# Patient Record
Sex: Female | Born: 1955 | Race: White | Hispanic: No | Marital: Married | Smoking: Never smoker
Health system: Southern US, Community
[De-identification: ages and names within clinical notes are randomized; demographics above are authoritative.]

## PROBLEM LIST (undated history)

## (undated) DIAGNOSIS — I10 Essential (primary) hypertension: Secondary | ICD-10-CM

## (undated) DIAGNOSIS — I2510 Atherosclerotic heart disease of native coronary artery without angina pectoris: Secondary | ICD-10-CM

## (undated) DIAGNOSIS — Z973 Presence of spectacles and contact lenses: Secondary | ICD-10-CM

## (undated) DIAGNOSIS — E119 Type 2 diabetes mellitus without complications: Secondary | ICD-10-CM

## (undated) HISTORY — PX: BREAST BIOPSY: SHX20

## (undated) HISTORY — PX: TONSILLECTOMY: SUR1361

## (undated) HISTORY — PX: BACK SURGERY: SHX140

## (undated) HISTORY — PX: APPENDECTOMY: SHX54

---

## 2000-07-22 ENCOUNTER — Encounter: Payer: Self-pay | Admitting: Obstetrics and Gynecology

## 2000-07-22 ENCOUNTER — Encounter: Admission: RE | Admit: 2000-07-22 | Discharge: 2000-07-22 | Payer: Self-pay | Admitting: Obstetrics and Gynecology

## 2001-11-04 ENCOUNTER — Encounter: Payer: Self-pay | Admitting: Family Medicine

## 2001-11-04 ENCOUNTER — Ambulatory Visit (HOSPITAL_COMMUNITY): Admission: RE | Admit: 2001-11-04 | Discharge: 2001-11-04 | Payer: Self-pay | Admitting: Family Medicine

## 2002-01-31 ENCOUNTER — Encounter: Payer: Self-pay | Admitting: Obstetrics and Gynecology

## 2002-01-31 ENCOUNTER — Encounter: Admission: RE | Admit: 2002-01-31 | Discharge: 2002-01-31 | Payer: Self-pay | Admitting: Obstetrics and Gynecology

## 2002-03-10 ENCOUNTER — Other Ambulatory Visit: Admission: RE | Admit: 2002-03-10 | Discharge: 2002-03-10 | Payer: Self-pay | Admitting: Obstetrics and Gynecology

## 2003-01-03 ENCOUNTER — Encounter: Payer: Self-pay | Admitting: Emergency Medicine

## 2003-01-03 ENCOUNTER — Emergency Department (HOSPITAL_COMMUNITY): Admission: EM | Admit: 2003-01-03 | Discharge: 2003-01-03 | Payer: Self-pay | Admitting: Emergency Medicine

## 2003-01-09 ENCOUNTER — Inpatient Hospital Stay (HOSPITAL_COMMUNITY): Admission: EM | Admit: 2003-01-09 | Discharge: 2003-01-12 | Payer: Self-pay | Admitting: *Deleted

## 2003-01-09 ENCOUNTER — Encounter: Payer: Self-pay | Admitting: Cardiovascular Disease

## 2003-02-06 ENCOUNTER — Encounter: Admission: RE | Admit: 2003-02-06 | Discharge: 2003-05-07 | Payer: Self-pay | Admitting: Family Medicine

## 2004-05-26 ENCOUNTER — Encounter: Admission: RE | Admit: 2004-05-26 | Discharge: 2004-05-26 | Payer: Self-pay | Admitting: Obstetrics and Gynecology

## 2004-05-28 ENCOUNTER — Encounter: Admission: RE | Admit: 2004-05-28 | Discharge: 2004-08-26 | Payer: Self-pay | Admitting: Family Medicine

## 2004-06-13 ENCOUNTER — Other Ambulatory Visit: Admission: RE | Admit: 2004-06-13 | Discharge: 2004-06-13 | Payer: Self-pay | Admitting: Obstetrics and Gynecology

## 2005-08-13 ENCOUNTER — Encounter: Admission: RE | Admit: 2005-08-13 | Discharge: 2005-08-13 | Payer: Self-pay | Admitting: Family Medicine

## 2005-08-13 ENCOUNTER — Other Ambulatory Visit: Admission: RE | Admit: 2005-08-13 | Discharge: 2005-08-13 | Payer: Self-pay | Admitting: Obstetrics and Gynecology

## 2006-10-14 ENCOUNTER — Encounter: Admission: RE | Admit: 2006-10-14 | Discharge: 2006-10-14 | Payer: Self-pay | Admitting: Family Medicine

## 2008-03-01 ENCOUNTER — Encounter: Admission: RE | Admit: 2008-03-01 | Discharge: 2008-03-01 | Payer: Self-pay | Admitting: Family Medicine

## 2008-03-09 ENCOUNTER — Encounter: Admission: RE | Admit: 2008-03-09 | Discharge: 2008-03-09 | Payer: Self-pay | Admitting: Family Medicine

## 2011-05-08 NOTE — H&P (Signed)
NAME:  Jocelyn [name], Jocelyn [name] NO.:  [id number]   MEDICAL RECORD NO.:  [id number]                   PATIENT TYPE:  INP   LOCATION:  1823                                 FACILITY:  [facility]   PHYSICIAN:  Vesta Mixer, M.D.              DATE OF BIRTH:  05/21/1956   DATE OF ADMISSION:  01/09/2003  DATE OF DISCHARGE:                                HISTORY & PHYSICAL   HISTORY OF PRESENT ILLNESS:  The patient is a 55 year old female with  history of hypertension, hypercholesterolemia and recent onset of chest  pain.  She is admitted to the hospital for further evaluation of her chest  pain.   The patient was seen last week for a similar episode of chest pain.  She did  not want to stay and have a stress test so she left the [facility]  emergency room.  Since that time she has continued to have significant  episodes of chest pain with minimal exertion.  These episodes have typically  been relieved with rest.  These episodes have been present for the past  month or so.   The patient denies having any significant episodes of chest pain with rest.   CURRENT MEDICATIONS:  1. Monopril 10 mg daily.  2. Advicor one daily.  3. Hydrochlorothiazide 25 mg daily.   ALLERGIES:  The patient has no known drug allergies.   PAST MEDICAL HISTORY:  1. Hypertension.  2. Hypercholesterolemia.   SOCIAL HISTORY:  The patient works in an office.  She does not smoke and  does not drink alcohol.   FAMILY HISTORY:  Positive for coronary artery disease in her father.   REVIEW OF SYMPTOMS:  Reviewed and was essentially negative.   PHYSICAL EXAMINATION:  GENERAL:  Patient is a young female in no acute  distress.  She is somewhat anxious.  VITAL SIGNS:  Blood pressure 134/70 with heart rate of 81.  HEENT:  Reveals 2+ carotids but no bruits.  No jugular venous distention and  no thyromegaly.  LUNGS:  Clear to auscultation.  HEART:  Regular rate, S1, S2.  She has no  murmurs, rubs or gallops.  ABDOMEN:  Reveals good bowel sounds, is nontender.  EXTREMITIES:  She has no cyanosis, clubbing or edema.  NEUROLOGICAL:  Nonfocal.  Pulses are normal.  SKIN:  Her skin is warm and dry.   LABORATORY DATA:  Electrocardiogram #1 reveals normal sinus rhythm.  She has  T wave inversions in the anterior and lateral leads.  Electrocardiogram #2  reveals T wave inversions with nonspecific ST and T wave abnormalities.   IMPRESSION:  The patient presents with chest pains with T wave inversion in  the anterior and lateral leads.  These signs and symptoms are consistent  with unstable angina.  I have discussed the risks, benefits and options  regarding heart catheterization.  The patient understands and agrees to  proceed.  We will place the patient on intravenous heparin overnight.  We  will proceed heart catheterization tomorrow as the schedule permits.                                               Vesta Mixer, M.D.    PJN/MEDQ  D:  01/09/2003  T:  01/09/2003  Job:  [id number]   cc:   Chales Salmon. Abigail Miyamoto, M.D.  [address]  [place]  [place] [address]  Fax: [phone]

## 2011-05-08 NOTE — Discharge Summary (Signed)
NAME:  Jocelyn [name], Jocelyn [name] NO.:  [id number]   MEDICAL RECORD NO.:  [id number]                   PATIENT TYPE:  INP   LOCATION:  6522                                 FACILITY:  [facility]   PHYSICIAN:  Vesta Mixer, M.D.              DATE OF BIRTH:  1956-03-27   DATE OF ADMISSION:  01/09/2003  DATE OF DISCHARGE:  01/12/2003                                 DISCHARGE SUMMARY   REASON FOR ADMISSION:  Jocelyn [name] is a young female with a history of  obesity, hypertension, hyperlipidemia, and diabetes mellitus.  She was  admitted on January 09, 2003, with symptoms consistent with unstable angina.   DISCHARGE DIAGNOSES:  1. Coronary artery disease, status post PTCA and stenting of her left     anterior descending artery and status post PTCA of her first diagonal     artery.  2. Hyperlipidemia.  3. Hypertension.  4. Obesity.  5. Diabetes mellitus.   DISPOSITION:  The patient will check her glucoses between 2-4 times a day.  She will record these and will report these back to [employer]. Abigail Miyamoto, M.D.  She is to eat a low fat, low salt, and low cholesterol diet.  She is to see  Vesta Mixer, M.D. in a week or two.   HISTORY:  Jocelyn [name] is a 55 year old female, with a history of obesity,  hypertension, hyperlipidemia, and a recent diagnosis of diabetes mellitus.  She recently presented to [facility] with episodes of chest pain.  She was readmitted to [facility] several days later for worsening  episodes of chest pain.  Please see dictated H&P for further details.   HOSPITAL COURSE BY PROBLEMS:  PROBLEM #1:  CORONARY ARTERY DISEASE:  The  patient had a heart catheterization on January 10, 2003.  She was found to  have a severe stenosis of the left anterior descending artery with a  relatively small artery.  She was also found to have moderate disease of her  left circumflex artery and first diagonal artery.  We initially consulted  CVTS for  consideration for coronary artery bypass grafting, but the patient  refused to have bypass surgery.  She was then scheduled for a PTCA and  stenting of her LAD.  She underwent successful PTC and stenting of her LAD  without significant complications.  We were able to put a 2.75 x 18 mm  CYPHER stent expanded up to 16 atmospheres.  We post-dilated this with a  2.75 Quantum Monorail up to 18 atmospheres.  She tolerated the procedure  quite well.  She had no complications.  There were no disruptions in blood  flow.  The patient will be treated medically for her circumflex disease.  We  will follow up with her in the next week or two.   PROBLEM #2:  DIABETES MELLITUS:  The patient was noted to have a  sugar of  250-something last week at [facility].  We will get her a home  Glucometer.  She has been instructed on a low-fat, low-salt, and low-  cholesterol diet.  We will have her follow up with Chales Salmon. Abigail Miyamoto, M.D.  for further management of her diabetes mellitus.   PROBLEM #3:  HYPERLIPIDEMIA:  The patient will be continued on her Avacor.  A repeat fasting lipid profile was drawn in the hospital upon discharge.  The results are not available at this time.  She may need a stronger statin  medication, but we will wait and see what her cholesterol levels look like.  She is to call me for any further problems.                                               Vesta Mixer, M.D.    PJN/MEDQ  D:  01/12/2003  T:  01/13/2003  Job:  [id number]   cc:   Chales Salmon. Abigail Miyamoto, M.D.  [address]  [place]  [place] [address]  Fax: [phone]

## 2011-05-08 NOTE — Cardiovascular Report (Signed)
NAME:  Jocelyn, [name] NO.:  [id number]   MEDICAL RECORD NO.:  [id number]                   PATIENT TYPE:  INP   LOCATION:  6522                                 FACILITY:  [facility]   PHYSICIAN:  Vesta Mixer, M.D.              DATE OF BIRTH:  09/16/1956   DATE OF PROCEDURE:  01/11/2003  DATE OF DISCHARGE:                              CARDIAC CATHETERIZATION   INDICATIONS FOR PROCEDURE:  The patient is a 55 year old female who was  admitted on January 20 with unstable angina.  Yesterday, she had a  diagnostic heart catheterization.  She was found to have an extremely tight  and eccentric left anterior descending stenosis which extended back to the  first diagonal artery.  She was also found to have moderate to severe  disease involving the distal circumflex artery and continuation branch of  the circumflex.  She was originally referred for coronary artery bypass  grafting, but she refused to have surgery.  We then scheduled her for PTCA  and stenting of the left anterior descending artery.   PROCEDURE:  Percutaneous transluminal coronary angioplasty and stenting of  the mid left anterior descending artery with percutaneous transluminal  coronary angioplasty of the first diagonal artery.   PROCEDURAL NOTE:  The right femoral artery was easily cannulated using a 7-  French sheath.  The left main was cannulated using a Judkins left 3.5, 7-  [place] guide.  The LAD was wired initially with a Patriot guidewire but was  later traded out for a BMW wire for added support.  The patient was given  5000 units of heparin followed by double bolus Integrilin drip.   The patient was predilated using a 2.75 x 15-mm Quantum Maverick.  We  inflated this balloon on four occasions:  4 atmospheres for 30 seconds, 8  atmospheres for 25 seconds, 8 atmospheres for 19 seconds, and 12 atmospheres  for 34 seconds.  We then attempted to pass an intravascular ultrasound  probe,  but we were not able to pass the probe down the stenosis.  Using the  balloon, we estimated that we would need an 18-mm stent.  We placed a 2.75 x  18-mm Cypher stent.  It was deployed at 16 atmospheres for 52 seconds.  We  then post dilated the stent using the 2.75 x 15-mm Quantum balloon.  We  inflated it distally at 18 atmospheres for 31 seconds, followed by a  proximal inflation of 18 atmospheres for 25 seconds.  We once again  attempted to place the intravascular ultrasound probe, but it would not go.  We had achieved a very nice result of the mid LAD.  There was some slight  compromise of the first diagonal vessel to about 70-80%.  We then took the  wire out of the LAD and placed it down into the diagonal vessel.  The 2.75-  mm balloon was  fairly winged and would not pass across the stent struts.  A  2.5 x 10-mm CrossSail was then placed across the struts.  Two inflations  were performed:  6 atmospheres for 24 seconds, followed by 10 atmospheres  for 22 seconds.  This resulted in a very nice angiographic result with TIMI  grade 3 down the LAD and the diagonal vessel.  The patient has received  approximately 240 cc of contrast.  We will not attempt any further  angioplasty.   COMPLICATIONS:  None.   CONCLUSION:  1. Successful percutaneous transluminal coronary angioplasty and stenting of     the left anterior descending artery     and percutaneous transluminal coronary angioplasty of the first diagonal     artery.  2. She has moderate disease involving the distal left circumflex artery.  We     will continue with medical therapy for this.                                               Vesta Mixer, M.D.    PJN/MEDQ  D:  01/11/2003  T:  01/12/2003  Job:  [id number]   cc:   Chales Salmon. Abigail Miyamoto, M.D.  [address]  [place]  [place] [address]  Fax: [phone]

## 2011-05-08 NOTE — Cardiovascular Report (Signed)
NAME:  Jocelyn [name], Jocelyn [name] NO.:  [id number]   MEDICAL RECORD NO.:  [id number]                   PATIENT TYPE:  INP   LOCATION:  4734                                 FACILITY:  [facility]   PHYSICIAN:  Vesta Mixer, M.D.              DATE OF BIRTH:  Aug 10, 1956   DATE OF PROCEDURE:  01/10/2003  DATE OF DISCHARGE:                              CARDIAC CATHETERIZATION   INDICATIONS:  The patient is a 55 year old female with a history of obesity,  hypertension, hyperlipidemia, and a recent diagnosis of diabetes mellitus.  She was admitted to the hospital yesterday with episodes of chest pain  conveyed by T wave inversions on the anterolateral leads. She is referred  for heart catheterization for further evaluation.   PROCEDURE:  Left heart catheterization with coronary angiography.   DESCRIPTION OF PROCEDURE:  The right femoral artery was easily cannulated  using a modified Seldinger technique.   HEMODYNAMICS:  The left ventricular pressure was 139/77 the aortic pressure  was 139/77.   ANGIOGRAPHY:  The left main coronary artery has a moderate 20-25% stenosis  at its midpoint.   The left anterior descending artery starts off as a fairly normal vessel,  approximately 3 mm in size.  It narrows down to approximately 50 stenosis  just prior to the first diagonal vessel.  The mid LAD has a tight eccentric  95% stenosis on a moderately tight bend.  This stenosis extends all the way  from the first diagonal vessel into the second diagonal vessel.  The distal  LAD has minor luminal irregularities.   The first diagonal vessel has only minor luminal irregularities and is a  fairly moderate sized vessel.  The second diagonal vessel has mild to  moderate irregularities and is a small to moderate sized vessel.   The size of the LAD throughout the stenosis appears to be between 2.25 and  2.5 mm in diameter.   The left circumflex artery is a moderate sized vessel.   There are mild to  moderate irregularities in the proximal segment. The first obtuse marginal  artery has mild to moderate irregularities between 10 and 20%.  The distal  circumflex artery has a 70% stenosis just after the takeoff of the first  obtuse marginal artery.   The right coronary artery is a large and dominant vessel.  There is mild  irregularities in the proximal segment between 10 and 20%. The posterior  descending artery and the posterolateral segment artery are unremarkable.   The left subclavian artery and the left internal mammary artery are normal.   LEFT VENTRICULOGRAM:  The left ventriculogram was performed in a 30 RAO  position. It reveals overall normal left ventricular systolic function.  The  anterior wall contracts normally.   COMPLICATIONS:  None.   CONCLUSIONS:  The patient has an extremely tight proximal left anterior  descending stenosis that involves the first  and second diagonal vessel. This  vessel is relatively small and at most 2.5 mm in diameter.  She has multiple  risk factors for coronary artery disease including hypertension,  hyperlipidemia, obesity, and new onset diabetes mellitus.  She had a glucose  measured in the [facility] ER of greater than 300 last week.  Given all of  these risk factors and the fact that she is diabetic and has a relatively  small vessel, I do not think that stenting is a good option since the risk  of re-stenosis would be significant.  I would like to get a Cardiovascular  Thoracic Surgeons consult and consider coronary artery bypass grafting.  We  will discuss these issues with the family and the patient.                                                 Vesta Mixer, M.D.    PJN/MEDQ  D:  01/10/2003  T:  01/11/2003  Job:  [id number]

## 2013-07-18 ENCOUNTER — Other Ambulatory Visit: Payer: Self-pay

## 2013-07-18 DIAGNOSIS — Z1231 Encounter for screening mammogram for malignant neoplasm of breast: Secondary | ICD-10-CM

## 2013-08-07 ENCOUNTER — Ambulatory Visit: Payer: Self-pay

## 2014-11-01 ENCOUNTER — Other Ambulatory Visit: Payer: Self-pay | Admitting: Obstetrics and Gynecology

## 2014-11-01 DIAGNOSIS — R928 Other abnormal and inconclusive findings on diagnostic imaging of breast: Secondary | ICD-10-CM

## 2014-11-19 ENCOUNTER — Encounter (INDEPENDENT_AMBULATORY_CARE_PROVIDER_SITE_OTHER): Payer: Self-pay

## 2014-11-19 ENCOUNTER — Other Ambulatory Visit: Payer: Self-pay | Admitting: Obstetrics and Gynecology

## 2014-11-19 ENCOUNTER — Ambulatory Visit
Admission: RE | Admit: 2014-11-19 | Discharge: 2014-11-19 | Disposition: A | Discharge status: Home / Self Care | Payer: BC Managed Care – PPO | Source: Ambulatory Visit | Attending: Obstetrics and Gynecology | Admitting: Obstetrics and Gynecology

## 2014-11-19 DIAGNOSIS — R928 Other abnormal and inconclusive findings on diagnostic imaging of breast: Secondary | ICD-10-CM

## 2014-12-03 ENCOUNTER — Ambulatory Visit
Admission: RE | Admit: 2014-12-03 | Discharge: 2014-12-03 | Disposition: A | Discharge status: Home / Self Care | Payer: BC Managed Care – PPO | Source: Ambulatory Visit | Attending: Obstetrics and Gynecology | Admitting: Obstetrics and Gynecology

## 2014-12-03 DIAGNOSIS — R928 Other abnormal and inconclusive findings on diagnostic imaging of breast: Secondary | ICD-10-CM

## 2015-06-19 ENCOUNTER — Other Ambulatory Visit: Payer: Self-pay | Admitting: Obstetrics and Gynecology

## 2015-06-19 DIAGNOSIS — R921 Mammographic calcification found on diagnostic imaging of breast: Secondary | ICD-10-CM

## 2015-07-02 ENCOUNTER — Ambulatory Visit
Admission: RE | Admit: 2015-07-02 | Discharge: 2015-07-02 | Disposition: A | Discharge status: Home / Self Care | Payer: BLUE CROSS/BLUE SHIELD | Source: Ambulatory Visit | Attending: Obstetrics and Gynecology | Admitting: Obstetrics and Gynecology

## 2015-07-02 DIAGNOSIS — R921 Mammographic calcification found on diagnostic imaging of breast: Secondary | ICD-10-CM

## 2015-11-19 ENCOUNTER — Other Ambulatory Visit: Payer: Self-pay | Admitting: Obstetrics and Gynecology

## 2015-11-19 ENCOUNTER — Other Ambulatory Visit: Payer: Self-pay

## 2015-11-19 DIAGNOSIS — Z1231 Encounter for screening mammogram for malignant neoplasm of breast: Secondary | ICD-10-CM

## 2015-12-10 ENCOUNTER — Ambulatory Visit
Admission: RE | Admit: 2015-12-10 | Discharge: 2015-12-10 | Disposition: A | Discharge status: Home / Self Care | Payer: BLUE CROSS/BLUE SHIELD | Source: Ambulatory Visit

## 2015-12-10 DIAGNOSIS — Z1231 Encounter for screening mammogram for malignant neoplasm of breast: Secondary | ICD-10-CM

## 2016-11-19 ENCOUNTER — Other Ambulatory Visit: Payer: Self-pay | Admitting: Internal Medicine

## 2016-11-19 DIAGNOSIS — Z1231 Encounter for screening mammogram for malignant neoplasm of breast: Secondary | ICD-10-CM

## 2017-01-01 ENCOUNTER — Ambulatory Visit (INDEPENDENT_AMBULATORY_CARE_PROVIDER_SITE_OTHER): Payer: BLUE CROSS/BLUE SHIELD | Admitting: Podiatry

## 2017-01-01 ENCOUNTER — Encounter: Payer: Self-pay | Admitting: Podiatry

## 2017-01-01 ENCOUNTER — Other Ambulatory Visit: Payer: Self-pay | Admitting: *Deleted

## 2017-01-01 VITALS — BP 108/69 | HR 75 | Resp 16

## 2017-01-01 DIAGNOSIS — S90229A Contusion of unspecified lesser toe(s) with damage to nail, initial encounter: Secondary | ICD-10-CM

## 2017-01-01 DIAGNOSIS — L601 Onycholysis: Secondary | ICD-10-CM | POA: Diagnosis not present

## 2017-01-01 DIAGNOSIS — L603 Nail dystrophy: Secondary | ICD-10-CM | POA: Diagnosis not present

## 2017-01-01 NOTE — Progress Notes (Signed)
   Subjective:    Patient ID: Jocelyn [name], female    DOB: 01/06/1956, 61 y.o.   MRN: [id number]  HPI 61 year old female presents the office today for concerns of bilateral second toenails becoming thick and discolored and she's had some dark discoloration to the toenails. She states that her left hallux toenail in her right third toenail also started to loosen. This been ongoing for several months. She denies any pain to the toes and she denies any surrounding redness or drainage or any pus. She is diabetic left A1c was 7.7. She denies any claudication symptoms. No other complaints.   Review of Systems  All other systems reviewed and are negative.      Objective:   Physical Exam General: AAO x3, NAD  Dermatological: Follow-up second toenails are hypertrophic, dystrophic, discolored there is evidence of cephalohematoma the entire toenail. Upon debridement the right second toenail is loose and almost completely coming off. There is lifting of the nail borders on the right third as well as the left hallux. There is no drainage pus expressed. There is a small cyst present on the proximal aspect of the nail border on the second toe. This is been ongoing for several years and is not new. This is a fairly not change in size.  Vascular: Dorsalis Pedis artery and Posterior Tibial artery pedal pulses are 2/4 bilateral with immedate capillary fill time. There is no pain with calf compression, swelling, warmth, erythema.   Neruologic: Grossly intact via light touch bilateral. Vibratory intact via tuning fork bilateral. Protective threshold with Semmes Wienstein monofilament intact to all pedal sites bilateral.   Musculoskeletal: No gross boney pedal deformities bilateral. No pain, crepitus, or limitation noted with foot and ankle range of motion bilateral. Muscular strength 5/5 in all groups tested bilateral.  Gait: Unassisted, Nonantalgic.      Assessment & Plan:  61 year old female with subungual  hematoma, onycholysis -Treatment options discussed including all alternatives, risks, and complications -Etiology of symptoms were discussed -I recommended bilateral second toenail avulsion Sever she declines this today. I debrided the nails and these [facility]  -Discussed the nails will likely come off. If they do come off recommended antibiotic ointment and a bandage. -Monitor for any signs or symptoms of infection. -Follow-up with nail culture sooner if any issues are to arise.  Ovid CurdMatthew Rica Heather, DPM

## 2017-01-05 ENCOUNTER — Encounter: Payer: Self-pay | Admitting: Radiology

## 2017-01-05 ENCOUNTER — Ambulatory Visit
Admission: RE | Admit: 2017-01-05 | Discharge: 2017-01-05 | Disposition: A | Discharge status: Home / Self Care | Payer: BLUE CROSS/BLUE SHIELD | Source: Ambulatory Visit | Attending: Internal Medicine | Admitting: Internal Medicine

## 2017-01-05 DIAGNOSIS — Z1231 Encounter for screening mammogram for malignant neoplasm of breast: Secondary | ICD-10-CM

## 2017-01-18 ENCOUNTER — Telehealth: Payer: Self-pay | Admitting: *Deleted

## 2017-01-18 NOTE — Telephone Encounter (Addendum)
-----   Message from Vivi BarrackMatthew R Wagoner, DPM sent at 01/13/2017  7:45 PM EST ----- Negative for fungus and just shows blood and damage to the toenail. Please let her know. 01/18/2017-unable to leave a message voicemail box is not set up yet. Left message on home phone to call for results. 01/22/2017-pt called for results. Informed pt of Dr. Gabriel RungWagoner's review of results.

## 2017-07-25 ENCOUNTER — Emergency Department (HOSPITAL_COMMUNITY): Payer: BLUE CROSS/BLUE SHIELD

## 2017-07-25 ENCOUNTER — Encounter (HOSPITAL_COMMUNITY): Payer: Self-pay | Admitting: *Deleted

## 2017-07-25 ENCOUNTER — Emergency Department (HOSPITAL_COMMUNITY)
Admission: EM | Admit: 2017-07-25 | Discharge: 2017-07-25 | Disposition: A | Discharge status: Home / Self Care | Payer: BLUE CROSS/BLUE SHIELD | Attending: Emergency Medicine | Admitting: Emergency Medicine

## 2017-07-25 DIAGNOSIS — Y93E2 Activity, laundry: Secondary | ICD-10-CM | POA: Insufficient documentation

## 2017-07-25 DIAGNOSIS — E119 Type 2 diabetes mellitus without complications: Secondary | ICD-10-CM | POA: Diagnosis not present

## 2017-07-25 DIAGNOSIS — Y92099 Unspecified place in other non-institutional residence as the place of occurrence of the external cause: Secondary | ICD-10-CM | POA: Insufficient documentation

## 2017-07-25 DIAGNOSIS — Z7982 Long term (current) use of aspirin: Secondary | ICD-10-CM | POA: Insufficient documentation

## 2017-07-25 DIAGNOSIS — Z7984 Long term (current) use of oral hypoglycemic drugs: Secondary | ICD-10-CM | POA: Diagnosis not present

## 2017-07-25 DIAGNOSIS — Y999 Unspecified external cause status: Secondary | ICD-10-CM | POA: Insufficient documentation

## 2017-07-25 DIAGNOSIS — S7002XA Contusion of left hip, initial encounter: Secondary | ICD-10-CM | POA: Insufficient documentation

## 2017-07-25 DIAGNOSIS — W108XXA Fall (on) (from) other stairs and steps, initial encounter: Secondary | ICD-10-CM | POA: Diagnosis not present

## 2017-07-25 DIAGNOSIS — I2510 Atherosclerotic heart disease of native coronary artery without angina pectoris: Secondary | ICD-10-CM | POA: Diagnosis not present

## 2017-07-25 DIAGNOSIS — Z79899 Other long term (current) drug therapy: Secondary | ICD-10-CM | POA: Insufficient documentation

## 2017-07-25 DIAGNOSIS — I10 Essential (primary) hypertension: Secondary | ICD-10-CM | POA: Diagnosis not present

## 2017-07-25 DIAGNOSIS — S79912A Unspecified injury of left hip, initial encounter: Secondary | ICD-10-CM | POA: Diagnosis present

## 2017-07-25 HISTORY — DX: Essential (primary) hypertension: I10

## 2017-07-25 HISTORY — DX: Atherosclerotic heart disease of native coronary artery without angina pectoris: I25.10

## 2017-07-25 HISTORY — DX: Type 2 diabetes mellitus without complications: E11.9

## 2017-07-25 MED ORDER — OXYCODONE-ACETAMINOPHEN 5-325 MG PO TABS: 1.0000 | ORAL_TABLET | Freq: Four times a day (QID) | ORAL | 0 refills | Status: DC | PRN

## 2017-07-25 MED ORDER — OXYCODONE-ACETAMINOPHEN 5-325 MG PO TABS
1.0000 | ORAL_TABLET | Freq: Once | ORAL | Status: AC
  Administered 2017-07-25: 1 via ORAL
  Filled 2017-07-25: qty 1

## 2017-07-25 NOTE — ED Notes (Signed)
Pt and family understood dc material. NAD Noted. Scripts given at dc 

## 2017-07-25 NOTE — ED Provider Notes (Signed)
[facility]-EMERGENCY DEPT Provider Note   CSN: [id number] Arrival date & time: 07/25/17  1436     History   Chief Complaint Chief Complaint  Patient presents with  . Hip Pain    HPI Jocelyn [name] is a 61 y.o. female.  The history is provided by the patient. No language interpreter was used.  Hip Pain     Jocelyn [name] is a 61 y.o. female who presents to the Emergency Department complaining of fall/hip pain. Yesterday she was walking down some stairs at her sister's house while carrying a load of blankets. She miss stepped and fell to the ground, striking her left side. She states she fell down only a few steps. She denies any head injury or loss of consciousness. She reports significant pain to her left hip while standing and walking. Pain is located in the left posterior hip. She does not have significant pain while she is lying. No numbness, weakness, headache, shortness of breath, abdominal pain.  Past Medical History:  Diagnosis Date  . Coronary artery disease   . Diabetes mellitus without complication (HCC)   . Hypertension     There are no active problems to display for this patient.   Past Surgical History:  Procedure Laterality Date  . APPENDECTOMY    . BACK SURGERY    . TONSILLECTOMY      OB History    No data available       Home Medications    Prior to Admission medications   Medication Sig Start Date End Date Taking? Authorizing Provider  aspirin 325 MG tablet Take 325 mg by mouth daily.    [provider]  hydrochlorothiazide (HYDRODIURIL) 25 MG tablet  10/07/16   [provider]  irbesartan (AVAPRO) 150 MG tablet  10/07/16   [provider]  JANUMET 50-1000 MG tablet  12/02/16   [provider]  JARDIANCE 25 MG TABS tablet  12/02/16   [provider]  meloxicam (MOBIC) 15 MG tablet  12/26/16   [provider]  omeprazole (PRILOSEC) 20 MG capsule  12/02/16   [provider]    oxyCODONE-acetaminophen (PERCOCET/ROXICET) 5-325 MG tablet Take 1 tablet by mouth every 6 (six) hours as needed for severe pain. 07/25/17   Tilden Fossaees, Geraldin Habermehl, MD  simvastatin (ZOCOR) 40 MG tablet  12/02/16   [provider]    Family History No family history on file.  Social History Social History  Substance Use Topics  . Smoking status: Never Smoker  . Smokeless tobacco: Never Used  . Alcohol use No     Allergies   Morphine and related   Review of Systems Review of Systems  All other systems reviewed and are negative.    Physical Exam Updated Vital Signs BP 101/63 (BP Location: Left Arm)   Pulse 91   Temp 98.1 F (36.7 C) (Oral)   Resp 16   SpO2 99%   Physical Exam  Constitutional: She is oriented to person, place, and time. She appears well-developed and well-nourished.  HENT:  Head: Normocephalic and atraumatic.  Cardiovascular: Normal rate and regular rhythm.   No murmur heard. Pulmonary/Chest: Effort normal and breath sounds normal. No respiratory distress.  Abdominal: Soft. There is no tenderness. There is no rebound and no guarding.  Musculoskeletal: She exhibits tenderness.  2+ DP pulses bilaterally. Patient is able to flex and extend at the hips and knees bilaterally. She does have tenderness to palpation over the left posterior hip. No midline thoracic or lumbar  tenderness to palpation.  Neurological: She is alert and oriented to person, place, and time.  5 out of 5 strength in bilateral lower extremities. Sensation to light touch intact in bilateral lower extremities  Skin: Skin is warm and dry.  Psychiatric: She has a normal mood and affect. Her behavior is normal.  Nursing note and vitals reviewed.    ED Treatments / Results  Labs (all labs ordered are listed, but only abnormal results are displayed) Labs Reviewed - No data to display  EKG  EKG Interpretation None       Radiology Ct Hip Left Wo Contrast  Result Date:  07/25/2017 CLINICAL DATA:  Patient fell downstairs onto left hip. Left hip pain. EXAM: CT OF THE LEFT HIP WITHOUT CONTRAST TECHNIQUE: Multidetector CT imaging of the left hip was performed according to the standard protocol. Multiplanar CT image reconstructions were also generated. COMPARISON:  07/25/2017 radiographs of the left hip and pelvis. FINDINGS: Bones/Joint/Cartilage Degenerative disc disease L4-5 and L5-S1 with associated mild facet arthropathy. The included left SI joint is intact. There is osteoarthritic joint space narrowing spurring of the pubic symphysis. No left hip joint dislocation or effusion. Degenerative subcortical cystic change at the left femoral head- neck juncture. No acute displaced fracture or suspicious osseous lesions are noted. Enthesopathy noted off the left greater trochanter. Ligaments Suboptimally assessed by CT. Muscles and Tendons Negative Soft tissues Soft tissue induration overlying the gluteal muscles about the left hip consistent with soft tissue contusion. Atherosclerosis along the left external iliac and common femoral arteries. Colonic diverticulosis is noted the included sigmoid. IMPRESSION: 1. No acute displaced fracture, joint effusion or malalignment is identified of the left hip. If the patient has persistent pain out of proportion to radiographic and CT findings, MRI may help exclude an occult fracture. 2. Soft tissue contusion about left hip. 3. Lower lumbar degenerative disc disease L4-5 and L5-S1 with mild facet arthropathy. Electronically Signed   By: Tollie Ethavid  Kwon M.D.   On: 07/25/2017 18:34   Dg Hip Unilat W Or Wo Pelvis 2-3 Views Left  Result Date: 07/25/2017 CLINICAL DATA:  Pt reports falling down a couple of stairs yesterday. Pt was walking and had hands full - didn't realize there were stairs and fell down them. Unable to bear weight on left leg without severe pain. No prev hx of injury or surgery EXAM: DG HIP (WITH OR WITHOUT PELVIS) 2-3V LEFT COMPARISON:   None. FINDINGS: There is no acute fracture or subluxation. Degenerative changes are seen in the lower spine. Large stool burden incidentally noted. A rounded calcification in left hemipelvis likely represents a calcified fibroid. There is atherosclerotic calcification of the left femoral artery. IMPRESSION: 1.  No evidence for acute  abnormality. 2. Suspect calcified fibroid. 3. Atherosclerosis of the left femoral artery. Electronically Signed   By: Norva PavlovElizabeth  Brown M.D.   On: 07/25/2017 16:07    Procedures Procedures (including critical care time)  Medications Ordered in ED Medications  oxyCODONE-acetaminophen (PERCOCET/ROXICET) 5-325 MG per tablet 1 tablet (1 tablet Oral Given 07/25/17 1513)     Initial Impression / Assessment and Plan / ED Course  I have reviewed the triage vital signs and the nursing notes.  Pertinent labs & imaging results that were available during my care of the patient were reviewed by me and considered in my medical decision making (see chart for details).     Patient here for evaluation of left hip pain after a fall yesterday. She is neurovascularly intact on examination  with point tenderness over the left posterior hip. She does have pain with weightbearing. Imaging is negative for acute fracture. Counseled patient on home care for hip contusion with rest and ice or heat as needed. Will provide prescription for Percocet for severe pain.  Discussed taking ibuprofen if possible. Discussed outpatient follow-up and return precautions.  Final Clinical Impressions(s) / ED Diagnoses   Final diagnoses:  Contusion of left hip, initial encounter    New Prescriptions New Prescriptions   OXYCODONE-ACETAMINOPHEN (PERCOCET/ROXICET) 5-325 MG TABLET    Take 1 tablet by mouth every 6 (six) hours as needed for severe pain.     Tilden Fossa, MD 07/25/17 [phone]

## 2017-07-25 NOTE — ED Triage Notes (Signed)
To ED for eval after falling down a couple of stairs yesterday. Pt was walking and had hands full - didn't realize there were stairs and fell down them. Pt without pain while sitting in wheelchair and leaning to right. Unable to bear weight on left leg without severe pain

## 2018-02-23 ENCOUNTER — Other Ambulatory Visit: Payer: Self-pay | Admitting: Internal Medicine

## 2018-02-23 DIAGNOSIS — Z1231 Encounter for screening mammogram for malignant neoplasm of breast: Secondary | ICD-10-CM

## 2018-03-15 ENCOUNTER — Ambulatory Visit
Admission: RE | Admit: 2018-03-15 | Discharge: 2018-03-15 | Disposition: A | Discharge status: Home / Self Care | Payer: BLUE CROSS/BLUE SHIELD | Source: Ambulatory Visit | Attending: Internal Medicine | Admitting: Internal Medicine

## 2018-03-15 DIAGNOSIS — Z1231 Encounter for screening mammogram for malignant neoplasm of breast: Secondary | ICD-10-CM

## 2019-05-01 ENCOUNTER — Other Ambulatory Visit: Payer: Self-pay | Admitting: Internal Medicine

## 2019-05-01 DIAGNOSIS — Z1231 Encounter for screening mammogram for malignant neoplasm of breast: Secondary | ICD-10-CM

## 2019-05-27 ENCOUNTER — Ambulatory Visit
Admission: RE | Admit: 2019-05-27 | Discharge: 2019-05-27 | Disposition: A | Discharge status: Home / Self Care | Payer: Managed Care, Other (non HMO) | Source: Ambulatory Visit | Attending: Internal Medicine | Admitting: Internal Medicine

## 2019-05-27 ENCOUNTER — Other Ambulatory Visit: Payer: Self-pay

## 2019-05-27 DIAGNOSIS — Z1231 Encounter for screening mammogram for malignant neoplasm of breast: Secondary | ICD-10-CM

## 2019-06-22 ENCOUNTER — Ambulatory Visit: Payer: BLUE CROSS/BLUE SHIELD

## 2019-09-01 ENCOUNTER — Encounter: Payer: Self-pay | Admitting: Podiatry

## 2019-09-01 ENCOUNTER — Ambulatory Visit (INDEPENDENT_AMBULATORY_CARE_PROVIDER_SITE_OTHER): Payer: Managed Care, Other (non HMO)

## 2019-09-01 ENCOUNTER — Other Ambulatory Visit: Payer: Self-pay

## 2019-09-01 ENCOUNTER — Ambulatory Visit (INDEPENDENT_AMBULATORY_CARE_PROVIDER_SITE_OTHER): Payer: Managed Care, Other (non HMO) | Admitting: Podiatry

## 2019-09-01 DIAGNOSIS — B351 Tinea unguium: Secondary | ICD-10-CM | POA: Diagnosis not present

## 2019-09-01 DIAGNOSIS — M779 Enthesopathy, unspecified: Secondary | ICD-10-CM | POA: Diagnosis not present

## 2019-09-01 DIAGNOSIS — E669 Obesity, unspecified: Secondary | ICD-10-CM | POA: Insufficient documentation

## 2019-09-01 DIAGNOSIS — M79671 Pain in right foot: Secondary | ICD-10-CM

## 2019-09-01 DIAGNOSIS — M778 Other enthesopathies, not elsewhere classified: Secondary | ICD-10-CM

## 2019-09-01 MED ORDER — DICLOFENAC SODIUM 1 % TD GEL: 2.0000 g | Freq: Four times a day (QID) | TRANSDERMAL | 2 refills | Status: AC

## 2019-09-04 ENCOUNTER — Telehealth: Payer: Self-pay | Admitting: *Deleted

## 2019-09-04 MED ORDER — NONFORMULARY OR COMPOUNDED ITEM: 11 refills | Status: AC

## 2019-09-04 NOTE — Telephone Encounter (Signed)
Dr. Jacqualyn Posey ordered [facility] Antifungal topical. Faxed orders to [facility].

## 2019-09-08 NOTE — Progress Notes (Signed)
Subjective: 63 year old female presents the office today for concerns of pain to the dorsal aspect of the right forefoot midfoot.  She has aching sensation as well as swelling.  Minimal redness.  Is been on the last 1.5 weeks.  No injury.  She is tried ice and Aleve without any relief.  She is diabetic and her last A1c was 7.3.  She is on gabapentin for restless leg syndrome. Denies any systemic complaints such as fevers, chills, nausea, vomiting. No acute changes since last appointment, and no other complaints at this time.   Objective: AAO x3, NAD DP/PT pulses palpable bilaterally, CRT less than 3 seconds There is tenderness palpation on the dorsal aspect of the midfoot on the right foot and there is localized edema but there is no swelling erythema warmth there is no specific area pinpoint tenderness identified at this time.  Flexor, extensor tendons appear to be intact.  MMT 5/5. Hallux nails are mildly dystrophic with yellow-brown discoloration.  No pain the nails no redness or drainage. No open lesions or pre-ulcerative lesions.  No pain with calf compression, swelling, warmth, erythema  Assessment: Right dorsal midfoot capsulitis; onychomycosis  Plan: -All treatment options discussed with the patient including all alternatives, risks, complications.  -X-rays obtained reviewed.  No definitive evidence of acute fracture. -Recommended Darco wedge shoe which I dispensed today.  Voltaren gel.  Ice to the area daily. -Order compound cream for onychomycosis. -Patient encouraged to call the office with any questions, concerns, change in symptoms.   Return in about 2 weeks (around 09/15/2019).  Trula Slade DPM

## 2019-09-11 ENCOUNTER — Telehealth: Payer: Self-pay | Admitting: *Deleted

## 2019-09-11 NOTE — Telephone Encounter (Signed)
Called and tried to leave a message for the patient and the patient's voice mail has not been set up. Jocelyn [name]

## 2019-09-15 ENCOUNTER — Encounter: Payer: Self-pay | Admitting: Podiatry

## 2019-09-15 ENCOUNTER — Other Ambulatory Visit: Payer: Self-pay

## 2019-09-15 ENCOUNTER — Ambulatory Visit (INDEPENDENT_AMBULATORY_CARE_PROVIDER_SITE_OTHER): Payer: Managed Care, Other (non HMO) | Admitting: Podiatry

## 2019-09-15 DIAGNOSIS — M779 Enthesopathy, unspecified: Secondary | ICD-10-CM | POA: Diagnosis not present

## 2019-09-15 DIAGNOSIS — M778 Other enthesopathies, not elsewhere classified: Secondary | ICD-10-CM

## 2019-09-15 DIAGNOSIS — R2241 Localized swelling, mass and lump, right lower limb: Secondary | ICD-10-CM

## 2019-09-20 NOTE — Progress Notes (Signed)
Subjective: 63 year old female presents the office today for follow evaluation of capsulitis, dorsal right midfoot pain.  She says overall she is doing much better she actually wants to cancel today's appointment.  She is also been using Voltaren gel.  She is also received a compound cream for nail fungus that she started to use last week. Denies any systemic complaints such as fevers, chills, nausea, vomiting. No acute changes since last appointment, and no other complaints at this time.   Objective: AAO x3, NAD DP/PT pulses palpable bilaterally, CRT less than 3 seconds Has had there is no significant discomfort to palpation on the dorsal aspect of the right midfoot.  There is no area pinpoint bony tenderness.  No significant edema, erythema.   Nails are mostly unchanged.  No pain in the nails. No pain with calf compression, swelling, warmth, erythema  Assessment: Capsulitis which is improved right foot with onychomycosis  Plan: -All treatment options discussed with the patient including all alternatives, risks, complications.  -On her transition back to wearing a regular shoe we discussed wearing a stiffer soled shoe.  Continue Voltaren, ice.  Gradually increase activity level. -Continue topical antifungal. -Patient encouraged to call the office with any questions, concerns, change in symptoms.   Trula Slade DPM

## 2020-05-22 ENCOUNTER — Other Ambulatory Visit: Payer: Self-pay | Admitting: Internal Medicine

## 2020-05-22 DIAGNOSIS — Z1231 Encounter for screening mammogram for malignant neoplasm of breast: Secondary | ICD-10-CM

## 2020-05-30 ENCOUNTER — Ambulatory Visit
Admission: RE | Admit: 2020-05-30 | Discharge: 2020-05-30 | Disposition: A | Discharge status: Home / Self Care | Payer: Managed Care, Other (non HMO) | Source: Ambulatory Visit | Attending: Internal Medicine | Admitting: Internal Medicine

## 2020-05-30 ENCOUNTER — Other Ambulatory Visit: Payer: Self-pay

## 2020-05-30 DIAGNOSIS — Z1231 Encounter for screening mammogram for malignant neoplasm of breast: Secondary | ICD-10-CM

## 2020-09-24 ENCOUNTER — Other Ambulatory Visit: Payer: Self-pay | Admitting: Registered Nurse

## 2020-09-24 ENCOUNTER — Ambulatory Visit
Admission: RE | Admit: 2020-09-24 | Discharge: 2020-09-24 | Disposition: A | Discharge status: Home / Self Care | Payer: Managed Care, Other (non HMO) | Source: Ambulatory Visit | Attending: Registered Nurse | Admitting: Registered Nurse

## 2020-09-24 ENCOUNTER — Other Ambulatory Visit: Payer: Self-pay

## 2020-09-24 DIAGNOSIS — R5383 Other fatigue: Secondary | ICD-10-CM

## 2020-09-24 DIAGNOSIS — R748 Abnormal levels of other serum enzymes: Secondary | ICD-10-CM

## 2020-09-24 DIAGNOSIS — R1013 Epigastric pain: Secondary | ICD-10-CM

## 2020-09-24 MED ORDER — IOPAMIDOL (ISOVUE-300) INJECTION 61%
100.0000 mL | Freq: Once | INTRAVENOUS | Status: AC | PRN
  Administered 2020-09-24: 100 mL via INTRAVENOUS

## 2020-09-25 ENCOUNTER — Other Ambulatory Visit: Payer: Self-pay

## 2020-09-25 ENCOUNTER — Emergency Department (HOSPITAL_COMMUNITY)
Admission: EM | Admit: 2020-09-25 | Discharge: 2020-09-25 | Disposition: A | Discharge status: Home / Self Care | Payer: Managed Care, Other (non HMO) | Attending: Emergency Medicine | Admitting: Emergency Medicine

## 2020-09-25 ENCOUNTER — Encounter (HOSPITAL_COMMUNITY): Payer: Self-pay

## 2020-09-25 DIAGNOSIS — K8050 Calculus of bile duct without cholangitis or cholecystitis without obstruction: Secondary | ICD-10-CM | POA: Insufficient documentation

## 2020-09-25 DIAGNOSIS — Z7984 Long term (current) use of oral hypoglycemic drugs: Secondary | ICD-10-CM | POA: Diagnosis not present

## 2020-09-25 DIAGNOSIS — Z7982 Long term (current) use of aspirin: Secondary | ICD-10-CM | POA: Diagnosis not present

## 2020-09-25 DIAGNOSIS — R799 Abnormal finding of blood chemistry, unspecified: Secondary | ICD-10-CM | POA: Diagnosis present

## 2020-09-25 DIAGNOSIS — Z79899 Other long term (current) drug therapy: Secondary | ICD-10-CM | POA: Diagnosis not present

## 2020-09-25 DIAGNOSIS — I10 Essential (primary) hypertension: Secondary | ICD-10-CM | POA: Diagnosis not present

## 2020-09-25 DIAGNOSIS — I2510 Atherosclerotic heart disease of native coronary artery without angina pectoris: Secondary | ICD-10-CM | POA: Insufficient documentation

## 2020-09-25 DIAGNOSIS — E119 Type 2 diabetes mellitus without complications: Secondary | ICD-10-CM | POA: Diagnosis not present

## 2020-09-25 DIAGNOSIS — N12 Tubulo-interstitial nephritis, not specified as acute or chronic: Secondary | ICD-10-CM | POA: Diagnosis not present

## 2020-09-25 LAB — URINALYSIS, ROUTINE W REFLEX MICROSCOPIC
Glucose, UA: >=500 mg/dL — AB
Glucose, UA: >=500 mg/dL — AB
Hgb urine dipstick: NEGATIVE
Hgb urine dipstick: NEGATIVE
Ketones, ur: 20 mg/dL — AB
Ketones, ur: 80 mg/dL — AB
Leukocytes,Ua: NEGATIVE
Leukocytes,Ua: NEGATIVE
Nitrite: NEGATIVE
Nitrite: POSITIVE — AB
Protein, ur: 30 mg/dL — AB
Protein, ur: 30 mg/dL — AB
Specific Gravity, Urine: 1.031 — ABNORMAL HIGH (ref 1.005–1.030)
Specific Gravity, Urine: 1.029 (ref 1.005–1.030)
pH: 5 (ref 5.0–8.0)
pH: 5 (ref 5.0–8.0)

## 2020-09-25 LAB — CBC
HCT: 39.2 % (ref 36.0–46.0)
Hemoglobin: 11.5 g/dL — ABNORMAL LOW (ref 12.0–15.0)
MCH: 22.5 pg — ABNORMAL LOW (ref 26.0–34.0)
MCHC: 29.3 g/dL — ABNORMAL LOW (ref 30.0–36.0)
MCV: 76.9 fL — ABNORMAL LOW (ref 80.0–100.0)
Platelets: 315 10*3/uL (ref 150–400)
RBC: 5.1 MIL/uL (ref 3.87–5.11)
RDW: 15 % (ref 11.5–15.5)
WBC: 9 10*3/uL (ref 4.0–10.5)
nRBC: 0 % (ref 0.0–0.2)

## 2020-09-25 LAB — COMPREHENSIVE METABOLIC PANEL
ALT: 123 U/L — ABNORMAL HIGH (ref 0–44)
AST: 64 U/L — ABNORMAL HIGH (ref 15–41)
Albumin: 3.7 g/dL (ref 3.5–5.0)
Alkaline Phosphatase: 256 U/L — ABNORMAL HIGH (ref 38–126)
Anion gap: 14 (ref 5–15)
BUN: 32 mg/dL — ABNORMAL HIGH (ref 8–23)
CO2: 26 mmol/L (ref 22–32)
Calcium: 9.6 mg/dL (ref 8.9–10.3)
Chloride: 97 mmol/L — ABNORMAL LOW (ref 98–111)
Creatinine, Ser: 0.85 mg/dL (ref 0.44–1.00)
GFR calc non Af Amer: >60 mL/min (ref >60)
Glucose, Bld: 113 mg/dL — ABNORMAL HIGH (ref 70–99)
Potassium: 3.8 mmol/L (ref 3.5–5.1)
Sodium: 137 mmol/L (ref 135–145)
Total Bilirubin: 5.3 mg/dL — ABNORMAL HIGH (ref 0.3–1.2)
Total Protein: 8 g/dL (ref 6.5–8.1)

## 2020-09-25 MED ORDER — ONDANSETRON 4 MG PO TBDP: 4.0000 mg | ORAL_TABLET | Freq: Three times a day (TID) | ORAL | 0 refills | Status: AC | PRN

## 2020-09-25 MED ORDER — CEPHALEXIN 500 MG PO CAPS
500.0000 mg | ORAL_CAPSULE | Freq: Once | ORAL | Status: AC
  Administered 2020-09-25: 500 mg via ORAL
  Filled 2020-09-25: qty 1

## 2020-09-25 MED ORDER — CEPHALEXIN 500 MG PO CAPS: 500.0000 mg | ORAL_CAPSULE | Freq: Four times a day (QID) | ORAL | 0 refills | Status: AC

## 2020-09-25 NOTE — ED Provider Notes (Signed)
[facility]-EMERGENCY DEPT Provider Note   CSN: [id number] Arrival date & time: 09/25/20  1235     History Chief Complaint  Patient presents with  . Abdominal Pain    Jocelyn [name] is a 64 y.o. female presents to the ER for abnormal and that was ordered by her primary care PA.  Patient states that she went for routine blood work on Monday for upcoming physical exam next week.  She was called and told that her liver function panel was elevated.  A CT A/P was ordered and they called her back telling her that she had a calculus in her common bile she had to go to the ER.  Patient tells me that on Sunday she went to eat fried shrimp with her family, that night she developed fever as high as 100.8, chills and pain along the right rib that radiated to the right flank, nausea.  She was "severely" constipated.  She felt very bloated and full.  She states her symptoms actually significantly improved yesterday.  Today she feels well and finally had a bowel movement.  No longer having any fever, nausea, abdominal pain.  No vomiting.  Patient states she has a history of acid reflux but stable on medicines.  Remembers another similar episode in July when she also had seafood and had fever, fatigue and constipation.  She thought that her symptoms were related to Covid but she actually got tested and was negative.  Has noticed her urine has been a little bit darker today but denies dysuria, hematuria, urinary frequency.  No history of kidney stones.   HPI     Past Medical History:  Diagnosis Date  . Coronary artery disease   . Diabetes mellitus without complication (HCC)   . Hypertension     Patient Active Problem List   Diagnosis Date Noted  . Obesity 09/01/2019    Past Surgical History:  Procedure Laterality Date  . APPENDECTOMY    . BACK SURGERY    . BREAST BIOPSY Left   . TONSILLECTOMY       OB History   No obstetric history on file.     Family History  Problem  Relation Age of Onset  . Breast cancer Sister        early 60s  . Breast cancer Maternal Aunt   . Breast cancer Cousin        30s  . Breast cancer Maternal Aunt   . Breast cancer Cousin        30 s    Social History   Tobacco Use  . Smoking status: Never Smoker  . Smokeless tobacco: Never Used  Substance Use Topics  . Alcohol use: No  . Drug use: Not on file    Home Medications Prior to Admission medications   Medication Sig Start Date End Date Taking? Authorizing Provider  acetaminophen (TYLENOL) 500 MG tablet Take 1,000 mg by mouth every 6 (six) hours as needed for moderate pain.   Yes [provider]  aspirin 325 MG tablet Take 325 mg by mouth daily.   Yes [provider]  diclofenac sodium (VOLTAREN) 1 % GEL Apply 2 g topically 4 (four) times daily. Rub into affected area of foot 2 to 4 times daily Patient taking differently: Apply 2 g topically 4 (four) times daily as needed.  09/01/19  Yes Vivi BarrackWagoner, Matthew R, DPM  gabapentin (NEURONTIN) 300 MG capsule Take 300 mg by mouth at bedtime.  05/29/19  Yes [provider]  hydrochlorothiazide (HYDRODIURIL) 25 MG tablet Take 25 mg by mouth daily.  10/07/16  Yes [provider]  JANUMET 50-1000 MG tablet Take 1 tablet by mouth 2 (two) times daily with a meal.  12/02/16  Yes [provider]  JARDIANCE 25 MG TABS tablet Take 25 mg by mouth daily.  12/02/16  Yes [provider]  meloxicam (MOBIC) 15 MG tablet Take 15 mg by mouth daily.  12/26/16  Yes [provider]  omeprazole (PRILOSEC) 20 MG capsule Take 20 mg by mouth daily.  12/02/16  Yes [provider]  simvastatin (ZOCOR) 40 MG tablet Take 40 mg by mouth daily.  12/02/16  Yes [provider]  telmisartan (MICARDIS) 20 MG tablet Take 20 mg by mouth daily.  07/12/19  Yes [provider]  cephALEXin (KEFLEX) 500 MG capsule Take 1 capsule (500 mg total) by mouth 4 (four) times daily. 09/25/20   Liberty Handy, PA-C  NONFORMULARY OR COMPOUNDED ITEM [facility]:  Antifungal topical - Terbinafine 3%, Fluconazole 2%, Tea Tree Oil 5%, Urea 10%, 2% Ibuprofen, in #30ml DMSO suspension. Apply to affected toenail(s) once at bedtime, or twice daily. Patient not taking: Reported on 09/25/2020 09/04/19   Vivi Barrack, DPM  ondansetron (ZOFRAN ODT) 4 MG disintegrating tablet Take 1 tablet (4 mg total) by mouth every 8 (eight) hours as needed for nausea or vomiting. 09/25/20   Liberty Handy, PA-C    Allergies    Morphine and related  Review of Systems   Review of Systems  Constitutional: Positive for fever (resolved).  Gastrointestinal: Positive for abdominal pain (resolved), constipation (resolved) and nausea.  All other systems reviewed and are negative.   Physical Exam Updated Vital Signs BP 129/72   Pulse 68   Temp 98.7 F (37.1 C) (Oral)   Resp 18   Ht 5' (1.524 m)   Wt 83 kg   SpO2 (!) 66%   BMI 35.74 kg/m   Physical Exam Vitals and nursing note reviewed.  Constitutional:      Appearance: She is well-developed.     Comments: Non toxic in NAD  HENT:     Head: Normocephalic and atraumatic.     Nose: Nose normal.  Eyes:     Conjunctiva/sclera: Conjunctivae normal.  Cardiovascular:     Rate and Rhythm: Normal rate and regular rhythm.  Pulmonary:     Effort: Pulmonary effort is normal.     Breath sounds: Normal breath sounds.  Abdominal:     General: Bowel sounds are normal.     Palpations: Abdomen is soft.     Tenderness: There is no abdominal tenderness.     Comments: No G/R/R. No suprapubic or CVA tenderness. Negative Murphy's and McBurney's. Active BS to lower quadrants. No distention.   Musculoskeletal:        General: Normal range of motion.     Cervical back: Normal range of motion.  Skin:    General: Skin is warm and dry.     Capillary Refill: Capillary refill takes less than 2 seconds.  Neurological:     Mental Status: She is alert.    Psychiatric:        Behavior: Behavior normal.     ED Results / Procedures / Treatments   Labs (all labs ordered are listed, but only abnormal results are displayed) Labs Reviewed  COMPREHENSIVE METABOLIC PANEL - Abnormal; Notable for the following components:      Result Value   Chloride  97 (*)    Glucose, Bld 113 (*)    BUN 32 (*)    AST 64 (*)    ALT 123 (*)    Alkaline Phosphatase 256 (*)    Total Bilirubin 5.3 (*)    All other components within normal limits  CBC - Abnormal; Notable for the following components:   Hemoglobin 11.5 (*)    MCV 76.9 (*)    MCH 22.5 (*)    MCHC 29.3 (*)    All other components within normal limits  URINALYSIS, ROUTINE W REFLEX MICROSCOPIC - Abnormal; Notable for the following components:   Color, Urine AMBER (*)    APPearance HAZY (*)    Specific Gravity, Urine 1.031 (*)    Glucose, UA >=500 (*)    Bilirubin Urine SMALL (*)    Ketones, ur 20 (*)    Protein, ur 30 (*)    Bacteria, UA MANY (*)    Non Squamous Epithelial 0-5 (*)    All other components within normal limits  URINALYSIS, ROUTINE W REFLEX MICROSCOPIC - Abnormal; Notable for the following components:   Color, Urine AMBER (*)    APPearance HAZY (*)    Glucose, UA >=500 (*)    Bilirubin Urine SMALL (*)    Ketones, ur 80 (*)    Protein, ur 30 (*)    Nitrite POSITIVE (*)    Bacteria, UA MANY (*)    All other components within normal limits    EKG None  Radiology CT ABDOMEN PELVIS W CONTRAST  Result Date: 09/24/2020 CLINICAL DATA:  Upper abdominal pain with elevated liver enzymes EXAM: CT ABDOMEN AND PELVIS WITH CONTRAST TECHNIQUE: Multidetector CT imaging of the abdomen and pelvis was performed using the standard protocol following bolus administration of intravenous contrast. CONTRAST:  ISOVUE-300 IOPAMIDOL (ISOVUE-300) INJECTION 61% COMPARISON:  None. FINDINGS: Lower chest: There is atelectatic change in the inferior lingula. There is no edema or airspace  consolidation in the lung bases. There are foci of coronary artery calcification. Hepatobiliary: No focal liver lesions are appreciable. There is cholelithiasis. There is a 3 mm calculus in the distal common bile duct causing biliary duct obstruction with intrahepatic and extrahepatic biliary duct dilatation. Pancreas: No pancreatic mass or inflammatory focus. Spleen: No splenic lesions are evident. Adrenals/Urinary Tract: Adrenals bilaterally appear normal. Kidneys bilaterally show no evident mass or hydronephrosis on either side. There is no appreciable renal or ureteral calculus on either side. Urinary bladder wall thickness is normal. Stomach/Bowel: There are multiple sigmoid diverticula without diverticulitis. There is moderate stool in the colon. There is no bowel obstruction. The terminal ileum appears normal. There is no demonstrable free air or portal venous air. Vascular/Lymphatic: There is no abdominal aortic aneurysm. There is aortic and iliac artery atherosclerotic calcification. Major venous structures appear patent. There is no evident adenopathy in the abdomen or pelvis. Reproductive: Uterus is anteverted.  No evident pelvic mass. Other: Appendix absent. No periappendiceal region inflammation. No abscess or ascites is evident in the abdomen or pelvis. There is a small umbilical hernia containing only fat. Musculoskeletal: There is degenerative change in the lower thoracic and lumbar regions. No blastic or lytic bone lesions. No intramuscular lesions. IMPRESSION: 1. 3 mm calculus in the distal common bile duct causing biliary duct obstruction. There is in addition cholelithiasis. There is a calculus near the junction of the gallbladder and cystic duct. Gallbladder wall does not appear overtly thickened. 2. No evident bowel obstruction. There are sigmoid diverticula without diverticulitis. No  abscess in the abdomen or pelvis. Appendix absent. 3. No renal or ureteral calculus. No hydronephrosis. Urinary  bladder wall thickness is normal. 4. Aortic Atherosclerosis (ICD10-I70.0). There also foci of coronary artery and iliac artery atherosclerotic calcification. 5.  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 These results will be called to the ordering clinician or representative by the Radiologist Assistant, and communication documented in the PACS or [employer]. Electronically Signed   By: Bretta Bang III M.D.   On: 09/24/2020 13:17    Procedures Procedures (including critical care time)  Medications Ordered in ED Medications  cephALEXin (KEFLEX) capsule 500 mg (500 mg Oral Given 09/25/20 2204)    ED Course  I have reviewed the triage vital signs and the nursing notes.  Pertinent labs & imaging results that were available during my care of the patient were reviewed by me and considered in my medical decision making (see chart for details).  Clinical Course as of Sep 25 2334  Wed Sep 25, 2020  1904 IMPRESSION: 1. 3 mm calculus in the distal common bile duct causing biliary duct obstruction. There is in addition cholelithiasis. There is a calculus near the junction of the gallbladder and cystic duct. Gallbladder wall does not appear overtly thickened.  2. No evident bowel obstruction. There are sigmoid diverticula without diverticulitis. No abscess in the abdomen or pelvis. Appendix absent.  3. No renal or ureteral calculus. No hydronephrosis. Urinary bladder wall thickness is normal.  4. Aortic Atherosclerosis (ICD10-I70.0). There also foci of coronary artery and iliac artery atherosclerotic calcification.  5.  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  These results will be called to the ordering clinician or representative by the Radiologist Assistant, and communication documented in the PACS or [employer].   Electronically Signed   By: Bretta Bang III M.D.   On: 09/24/2020 13:17   [CG]  1934 Squamous  Epithelial / LPF: 6-10 [CG]  1934 Bacteria, UA(!): MANY [CG]  2035 Dr Ewing Schlein 8 am clear liquids ercp fri am    [CG]  2153 Ketones, ur(!): 80 [CG]  2153 Nitrite(!): POSITIVE [CG]  2153 WBC, UA: 6-10 [CG]  2153 Bacteria, UA(!): MANY [CG]  2153 Squamous Epithelial / LPF: 0-5 [CG]    Clinical Course User Index [CG] Jerrell Mylar   MDM Rules/Calculators/A&P                          Patient here for abnormal LFT and CTAP ordered by PCP yesterday.    She is asymptomatic on arrival. No abdominal or CVA tenderness on exam. Afebrile.   EMR triage and nursing notes reviewed to assist with MDM and obtain more history  CTAP ordered by PCP NP yesterday as above.  LFTs today elevated. No leukocytosis.  ER work up initiated in triage by triage RN including CBC CMP UA.    ER work up personally visualized and interpreted.   ER work up reveals elevated LFT as above, total bili 5.3, small bilirubin in UA. No leukocytosis. Initial UA collected was poor sample, patient provided second clean catch urinalysis and showed +nitrites, many bacteria, WBCs consistent with UTI. This could have contributed to patient's low garde fever over the weekend 100.8.  Urine culture sent  Patient re-evaluated. Remains completely asymptomatic. Afebrile without tachycardia. No leukocytosis. No abdominal or flank tenderness.   Discussed CT and LFT with Dr Ewing Schlein.  Given benign abdominal exam here,  asymptomatic patient deemed appropriate for discharge. Dr Ewing Schlein will see patient in office tomorrow 8:15, clear liquids.  Plan for ERCP Friday morning.  Discussed plan with patient who is comfortable with this and prefers avoiding hospitalization.    Will discharge with keflex for UTI/pyelonephritis although has no CVAT and right rib/back pain likely from CBD stone.    Strict return precautions given. Discussed with EDP Pickering.  Final Clinical Impression(s) / ED Diagnoses Final diagnoses:  Pyelonephritis of right  kidney  Common bile duct calculi    Rx / DC Orders ED Discharge Orders         Ordered    cephALEXin (KEFLEX) 500 MG capsule  4 times daily        09/25/20 2159    ondansetron (ZOFRAN ODT) 4 MG disintegrating tablet  Every 8 hours PRN        09/25/20 2159           Jerrell Mylar 09/25/20 2335    Benjiman Core, MD 09/28/20 [phone]

## 2020-09-25 NOTE — Discharge Instructions (Addendum)
Urinalysis confirms urinary tract infection, given your pain in the right flank and fever we will treat this as an infection in your bladder and your kidney.  Please take Keflex as prescribed every 6 hours.  Increase oral fluids.  Please go to Dr. Marlane Hatcher office tomorrow at 8:15 AM.  Clear fluids only until you see him.  He will discuss further procedures necessary to treat the common bile duct stone  Return to the ER immediately if you have fever greater than 100.4, severe worsening constant pain, nausea, vomiting, worsening flank pain, inability to urinate

## 2020-09-25 NOTE — ED Triage Notes (Addendum)
Pt arrived via walk in, c/o intermittent n/v and diffuse abd pain x3 days. States she was seen by PCP and told she could have possible issues with her gal bladder but they could not treat them in the office and advised to come to ED. Pt states pain, n/v has subsided since this morning.

## 2020-09-26 ENCOUNTER — Other Ambulatory Visit (HOSPITAL_COMMUNITY)
Admission: RE | Admit: 2020-09-26 | Discharge: 2020-09-26 | Disposition: A | Discharge status: Home / Self Care | Payer: Managed Care, Other (non HMO) | Source: Ambulatory Visit | Attending: Gastroenterology | Admitting: Gastroenterology

## 2020-09-26 ENCOUNTER — Other Ambulatory Visit: Payer: Self-pay | Admitting: Gastroenterology

## 2020-09-26 ENCOUNTER — Encounter (HOSPITAL_COMMUNITY): Payer: Self-pay | Admitting: Gastroenterology

## 2020-09-26 ENCOUNTER — Other Ambulatory Visit: Payer: Self-pay

## 2020-09-26 DIAGNOSIS — Z01818 Encounter for other preprocedural examination: Secondary | ICD-10-CM | POA: Insufficient documentation

## 2020-09-26 DIAGNOSIS — Z20822 Contact with and (suspected) exposure to covid-19: Secondary | ICD-10-CM | POA: Insufficient documentation

## 2020-09-26 LAB — SARS CORONAVIRUS 2 (TAT 6-24 HRS): SARS Coronavirus 2: NEGATIVE

## 2020-09-27 ENCOUNTER — Ambulatory Visit (HOSPITAL_COMMUNITY): Payer: Managed Care, Other (non HMO) | Admitting: Certified Registered Nurse Anesthetist

## 2020-09-27 ENCOUNTER — Encounter (HOSPITAL_COMMUNITY): Payer: Self-pay | Admitting: Gastroenterology

## 2020-09-27 ENCOUNTER — Ambulatory Visit (HOSPITAL_COMMUNITY): Payer: Managed Care, Other (non HMO)

## 2020-09-27 ENCOUNTER — Other Ambulatory Visit: Payer: Self-pay

## 2020-09-27 ENCOUNTER — Encounter (HOSPITAL_COMMUNITY)
Admission: RE | Disposition: A | Discharge status: Home / Self Care | Payer: Self-pay | Source: Home / Self Care | Attending: Gastroenterology

## 2020-09-27 ENCOUNTER — Ambulatory Visit (HOSPITAL_COMMUNITY)
Admission: RE | Admit: 2020-09-27 | Discharge: 2020-09-27 | Disposition: A | Discharge status: Home / Self Care | Payer: Managed Care, Other (non HMO) | Attending: Gastroenterology | Admitting: Gastroenterology

## 2020-09-27 DIAGNOSIS — K8050 Calculus of bile duct without cholangitis or cholecystitis without obstruction: Secondary | ICD-10-CM | POA: Diagnosis not present

## 2020-09-27 DIAGNOSIS — Z7984 Long term (current) use of oral hypoglycemic drugs: Secondary | ICD-10-CM | POA: Diagnosis not present

## 2020-09-27 DIAGNOSIS — Z885 Allergy status to narcotic agent status: Secondary | ICD-10-CM | POA: Insufficient documentation

## 2020-09-27 DIAGNOSIS — E119 Type 2 diabetes mellitus without complications: Secondary | ICD-10-CM | POA: Diagnosis not present

## 2020-09-27 DIAGNOSIS — K8070 Calculus of gallbladder and bile duct without cholecystitis without obstruction: Secondary | ICD-10-CM | POA: Diagnosis present

## 2020-09-27 DIAGNOSIS — I10 Essential (primary) hypertension: Secondary | ICD-10-CM | POA: Diagnosis not present

## 2020-09-27 DIAGNOSIS — Z79899 Other long term (current) drug therapy: Secondary | ICD-10-CM | POA: Insufficient documentation

## 2020-09-27 DIAGNOSIS — D649 Anemia, unspecified: Secondary | ICD-10-CM | POA: Insufficient documentation

## 2020-09-27 DIAGNOSIS — I2510 Atherosclerotic heart disease of native coronary artery without angina pectoris: Secondary | ICD-10-CM | POA: Insufficient documentation

## 2020-09-27 HISTORY — DX: Presence of spectacles and contact lenses: Z97.3

## 2020-09-27 HISTORY — PX: ERCP: SHX5425

## 2020-09-27 HISTORY — PX: SPHINCTEROTOMY: SHX5544

## 2020-09-27 HISTORY — PX: REMOVAL OF STONES: SHX5545

## 2020-09-27 LAB — GLUCOSE, CAPILLARY: Glucose-Capillary: 133 mg/dL — ABNORMAL HIGH (ref 70–99)

## 2020-09-27 SURGERY — ERCP, WITH INTERVENTION IF INDICATED
Anesthesia: General

## 2020-09-27 MED ORDER — DEXAMETHASONE SODIUM PHOSPHATE 10 MG/ML IJ SOLN
INTRAMUSCULAR | Status: DC | PRN
  Administered 2020-09-27: 4 mg via INTRAVENOUS

## 2020-09-27 MED ORDER — SUGAMMADEX SODIUM 200 MG/2ML IV SOLN
INTRAVENOUS | Status: DC | PRN
  Administered 2020-09-27: 200 mg via INTRAVENOUS

## 2020-09-27 MED ORDER — ONDANSETRON HCL 4 MG/2ML IJ SOLN
INTRAMUSCULAR | Status: DC | PRN
  Administered 2020-09-27: 4 mg via INTRAVENOUS

## 2020-09-27 MED ORDER — INDOMETHACIN 50 MG RE SUPP
RECTAL | Status: AC
  Filled 2020-09-27: qty 1

## 2020-09-27 MED ORDER — SODIUM CHLORIDE 0.9 % IV SOLN: INTRAVENOUS | Status: DC

## 2020-09-27 MED ORDER — PHENYLEPHRINE 40 MCG/ML (10ML) SYRINGE FOR IV PUSH (FOR BLOOD PRESSURE SUPPORT)
PREFILLED_SYRINGE | INTRAVENOUS | Status: DC | PRN
  Administered 2020-09-27 (×2): 80 ug via INTRAVENOUS

## 2020-09-27 MED ORDER — GLUCAGON HCL RDNA (DIAGNOSTIC) 1 MG IJ SOLR
INTRAMUSCULAR | Status: AC
  Filled 2020-09-27: qty 1

## 2020-09-27 MED ORDER — SODIUM CHLORIDE 0.9 % IV SOLN
INTRAVENOUS | Status: DC | PRN
  Administered 2020-09-27: 25 mL

## 2020-09-27 MED ORDER — PROPOFOL 10 MG/ML IV BOLUS
INTRAVENOUS | Status: DC | PRN
  Administered 2020-09-27: 170 mg via INTRAVENOUS
  Administered 2020-09-27: 10 mg via INTRAVENOUS

## 2020-09-27 MED ORDER — FENTANYL CITRATE (PF) 100 MCG/2ML IJ SOLN
INTRAMUSCULAR | Status: DC | PRN
Start: 2020-09-27 — End: 2020-09-27
  Administered 2020-09-27 (×2): 50 ug via INTRAVENOUS

## 2020-09-27 MED ORDER — ROCURONIUM BROMIDE 10 MG/ML (PF) SYRINGE
PREFILLED_SYRINGE | INTRAVENOUS | Status: DC | PRN
  Administered 2020-09-27: 40 mg via INTRAVENOUS

## 2020-09-27 MED ORDER — LACTATED RINGERS IV SOLN
INTRAVENOUS | Status: DC
  Administered 2020-09-27: 1000 mL via INTRAVENOUS

## 2020-09-27 MED ORDER — LIDOCAINE 2% (20 MG/ML) 5 ML SYRINGE
INTRAMUSCULAR | Status: DC | PRN
  Administered 2020-09-27: 60 mg via INTRAVENOUS

## 2020-09-27 MED ORDER — CIPROFLOXACIN IN D5W 400 MG/200ML IV SOLN
INTRAVENOUS | Status: AC
  Filled 2020-09-27: qty 200

## 2020-09-27 MED ORDER — CIPROFLOXACIN IN D5W 400 MG/200ML IV SOLN
INTRAVENOUS | Status: DC | PRN
  Administered 2020-09-27: 400 mg via INTRAVENOUS

## 2020-09-27 MED ORDER — FENTANYL CITRATE (PF) 100 MCG/2ML IJ SOLN
INTRAMUSCULAR | Status: AC
  Filled 2020-09-27: qty 2

## 2020-09-27 NOTE — Progress Notes (Signed)
Jocelyn [name] 10:06 AM  Subjective: Patient without any new complaints or problems since we saw her yesterday in the office  Objective: Vital signs stable afebrile exam please see preassessment evaluation CT reviewed and labs  Assessment: Gallstones and CBD stone  Plan: Okay to proceed with ERCP today with anesthesia assistance with further work-up and plans pending those findings  [facility]  office [phone] After 5PM or if no answer call [phone]

## 2020-09-27 NOTE — Op Note (Signed)
[facility] Patient Name: Jocelyn [name] Procedure Date: 09/27/2020 MRN: [id number] Attending MD: Vida Rigger , MD Date of Birth: 1956/07/06 CSN: [id number] Age: 64 Admit Type: Outpatient Procedure:                ERCP Indications:              For therapy of bile duct stone(s), Jaundice,                            Elevated liver enzymes Providers:                Vida Rigger, MD, Dwain Sarna, RN, Kandice Robinsons,                            Technician, Leanne Lovely, Technician, Waymond Cera, CRNA Referring MD:              Medicines:                General Anesthesia Complications:            No immediate complications. Estimated Blood Loss:     Estimated blood loss: none. Procedure:                Pre-Anesthesia Assessment:                           - Prior to the procedure, a History and Physical                            was performed, and patient medications and                            allergies were reviewed. The patient's tolerance of                            previous anesthesia was also reviewed. The risks                            and benefits of the procedure and the sedation                            options and risks were discussed with the patient.                            All questions were answered, and informed consent                            was obtained. Prior Anticoagulants: The patient has                            taken no previous anticoagulant or antiplatelet                            agents except for aspirin. ASA Grade  Assessment: II                            - A patient with mild systemic disease. After                            reviewing the risks and benefits, the patient was                            deemed in satisfactory condition to undergo the                            procedure.                           After obtaining informed consent, the scope was                            passed  under direct vision. Throughout the                            procedure, the patient's blood pressure, pulse, and                            oxygen saturations were monitored continuously. The                            TJF-Q180V ([id number] Olympus Duodenoscope was                            introduced through the mouth, and used to inject                            contrast into and used to cannulate the bile duct.                            The ERCP was accomplished without difficulty. The                            patient tolerated the procedure well. Scope In: Scope Out: Findings:      The major papilla was normal. Deep selective cannulation was obtained on       the first attempt and there was no pancreatic duct injection or wire       advancement throughout the procedure and an obvious stone was seen on       the first cholangiogram and biliary sphincterotomy was made with a       Hydratome sphincterotome using ERBE electrocautery. There was no       post-sphincterotomy bleeding. We proceeded till we had adequate biliary       drainage and could get the fully bowed sphincterotome easily in and out       of the duct and choledocholithiasis was found in a nondilated duct. The       biliary tree was swept with a 12 mm balloon starting at the bifurcation.       All  stones were removed on the first pull-through. Multiple subsequent       pull-through's were negative and nothing was found on occlusion       cholangiogram done in the customary fashion and the balloon easily       passed through the patent sphincterotomy site and there was some biliary       drainage although slightly sluggish but we elected to stop the procedure       at this point and there was no cystic duct or gallbladder filling not       mentioned above. Impression:               - The major papilla appeared normal.                           - Choledocholithiasis was found. Complete removal                             was accomplished by biliary sphincterotomy and                            balloon extraction.                           - A biliary sphincterotomy was performed.                           - The biliary tree was swept and nothing was found                            at the end of the procedure. Moderate Sedation:      Not Applicable - Patient had care per Anesthesia. Recommendation:           - Clear liquid diet for 6 hours. If doing well at                            6:30 PM may have soft solids                           - Continue present medications.                           - No aspirin, ibuprofen, naproxen, or other                            non-steroidal anti-inflammatory drugs for 3 days.                           - Return to GI clinic PRN.                           - Telephone GI clinic if symptomatic PRN.                           - Check liver enzymes (AST, ALT, alkaline  phosphatase, bilirubin) in 5 days or on same day as                            surgical appointment.                           - Refer to a surgeon at appointment to be scheduled                            referral has already been made. Procedure Code(s):        --- Professional ---                           [phone], Endoscopic retrograde                            cholangiopancreatography (ERCP); with removal of                            calculi/debris from biliary/pancreatic duct(s)                           43262, Endoscopic retrograde                            cholangiopancreatography (ERCP); with                            sphincterotomy/papillotomy Diagnosis Code(s):        --- Professional ---                           K80.50, Calculus of bile duct without cholangitis                            or cholecystitis without obstruction                           R17, Unspecified jaundice                           R74.8, Abnormal levels of other serum enzymes CPT copyright 2019  American Medical Association. All rights reserved. The codes documented in this report are preliminary and upon coder review may  be revised to meet current compliance requirements. Vida RiggerMarc Cristiano Capri, MD 09/27/2020 1:02:50 PM This report has been signed electronically. Number of Addenda: 0

## 2020-09-27 NOTE — Anesthesia Preprocedure Evaluation (Signed)
Anesthesia Evaluation  Patient identified by MRN, date of birth, ID band Patient awake    Reviewed: Allergy & Precautions, NPO status , Patient's Chart, lab work & pertinent test results  Airway Mallampati: III  TM Distance: >3 FB Neck ROM: Full    Dental  (+) Dental Advisory Given   Pulmonary neg pulmonary ROS,    breath sounds clear to auscultation       Cardiovascular hypertension, Pt. on medications + CAD   Rhythm:Regular Rate:Normal     Neuro/Psych negative neurological ROS     GI/Hepatic negative GI ROS, Neg liver ROS,   Endo/Other  diabetes, Type 2, Oral Hypoglycemic Agents  Renal/GU      Musculoskeletal   Abdominal   Peds  Hematology  (+) anemia ,   Anesthesia Other Findings   Reproductive/Obstetrics                             Anesthesia Physical Anesthesia Plan  ASA: II  Anesthesia Plan: General   Post-op Pain Management:    Induction: Intravenous  PONV Risk Score and Plan: 3 and Dexamethasone, Ondansetron and Treatment may vary due to age or medical condition  Airway Management Planned: Oral ETT  Additional Equipment:   Intra-op Plan:   Post-operative Plan: Extubation in OR  Informed Consent: I have reviewed the patients History and Physical, chart, labs and discussed the procedure including the risks, benefits and alternatives for the proposed anesthesia with the patient or authorized representative who has indicated his/her understanding and acceptance.     Dental advisory given  Plan Discussed with: CRNA  Anesthesia Plan Comments:         Anesthesia Quick Evaluation

## 2020-09-27 NOTE — Anesthesia Postprocedure Evaluation (Signed)
Anesthesia Post Note  Patient: Jocelyn [name]  Procedure(s) Performed: ENDOSCOPIC RETROGRADE CHOLANGIOPANCREATOGRAPHY (ERCP) (N/A ) SPHINCTEROTOMY REMOVAL OF STONES     Patient location during evaluation: PACU Anesthesia Type: General Level of consciousness: awake and alert Pain management: pain level controlled Vital Signs Assessment: post-procedure vital signs reviewed and stable Respiratory status: spontaneous breathing, nonlabored ventilation, respiratory function stable and patient connected to nasal cannula oxygen Cardiovascular status: blood pressure returned to baseline and stable Postop Assessment: no apparent nausea or vomiting Anesthetic complications: no   No complications documented.  Last Vitals:  Vitals:   09/27/20 1312 09/27/20 1320  BP: 127/71 133/82  Pulse: 78 81  Resp: 13 16  Temp: 36.9 C   SpO2: 100% 98%    Last Pain:  Vitals:   09/27/20 1320  TempSrc:   PainSc: 0-No pain                 Kennieth Rad

## 2020-09-27 NOTE — Transfer of Care (Signed)
Immediate Anesthesia Transfer of Care Note  Patient: Jocelyn [name]  Procedure(s) Performed: ENDOSCOPIC RETROGRADE CHOLANGIOPANCREATOGRAPHY (ERCP) (N/A ) SPHINCTEROTOMY REMOVAL OF STONES  Patient Location: PACU  Anesthesia Type:General  Level of Consciousness: awake, alert  and oriented  Airway & Oxygen Therapy: Patient Spontanous Breathing and Patient connected to face mask oxygen  Post-op Assessment: Report given to RN and Post -op Vital signs reviewed and stable  Post vital signs: Reviewed and stable  Last Vitals:  Vitals Value Taken Time  BP 127/71 09/27/20 1312  Temp    Pulse 75 09/27/20 1313  Resp 14 09/27/20 1313  SpO2 100 % 09/27/20 1313  Vitals shown include unvalidated device data.  Last Pain:  Vitals:   09/27/20 1006  TempSrc: Oral  PainSc: 0-No pain         Complications: No complications documented.

## 2020-09-27 NOTE — Anesthesia Procedure Notes (Addendum)
Procedure Name: Intubation Date/Time: 09/27/2020 12:16 PM Performed by: Gean Maidens, CRNA Pre-anesthesia Checklist: Patient identified, Emergency Drugs available, Suction available, Patient being monitored and Timeout performed Patient Re-evaluated:Patient Re-evaluated prior to induction Oxygen Delivery Method: Circle system utilized Preoxygenation: Pre-oxygenation with 100% oxygen Induction Type: IV induction Ventilation: Mask ventilation without difficulty Laryngoscope Size: Mac and 3 Grade View: Grade I Tube type: Oral Tube size: 7.0 mm Number of attempts: 1 Airway Equipment and Method: Stylet Placement Confirmation: ETT inserted through vocal cords under direct vision,  positive ETCO2 and breath sounds checked- equal and bilateral Secured at: 21 cm Tube secured with: Tape Dental Injury: Teeth and Oropharynx as per pre-operative assessment

## 2020-09-27 NOTE — Discharge Instructions (Signed)
YOU HAD AN ENDOSCOPIC PROCEDURE TODAY: Refer to the procedure report and other information in the discharge instructions given to you for any specific questions about what was found during the examination. If this information does not answer your questions, please call [facility] office at [phone] to clarify.   YOU SHOULD EXPECT: Some feelings of bloating in the abdomen. Passage of more gas than usual. Walking can help get rid of the air that was put into your GI tract during the procedure and reduce the bloating. If you had a lower endoscopy (such as a colonoscopy or flexible sigmoidoscopy) you may notice spotting of blood in your stool or on the toilet paper. Some abdominal soreness may be present for a day or two, also.  DIET: Your first meal following the procedure should be a light meal and then it is ok to progress to your normal diet. A half-sandwich or bowl of soup is an example of a good first meal. Heavy or fried foods are harder to digest and may make you feel nauseous or bloated. Drink plenty of fluids but you should avoid alcoholic beverages for 24 hours. If you had a esophageal dilation, please see attached instructions for diet.    ACTIVITY: Your care partner should take you home directly after the procedure. You should plan to take it easy, moving slowly for the rest of the day. You can resume normal activity the day after the procedure however YOU SHOULD NOT DRIVE, use power tools, machinery or perform tasks that involve climbing or major physical exertion for 24 hours (because of the sedation medicines used during the test).   SYMPTOMS TO REPORT IMMEDIATELY: A gastroenterologist can be reached at any hour. Please call [phone]  for any of the following symptoms:   . Following upper endoscopy (EGD, EUS, ERCP, esophageal dilation) Vomiting of blood or coffee ground material  New, significant abdominal pain  New, significant chest pain or pain under the shoulder blades  Painful  or persistently difficult swallowing  New shortness of breath  Black, tarry-looking or red, bloody stools  FOLLOW UP:  If any biopsies were taken you will be contacted by phone or by letter within the next 1-3 weeks. Call [phone]  if you have not heard about the biopsies in 3 weeks.   Please also call with any specific questions about appointments or follow up tests. Clear liquid diet only until 6:30 PM and if doing well may have soft solids at that time and no aspirin for 3 days and call if question or problem otherwise we will repeat liver tests in our office in 5 days or on day you see the surgeon in our building and follow-up with me as needed or in 6 months to discuss colonoscopy

## 2020-09-30 ENCOUNTER — Encounter (HOSPITAL_COMMUNITY): Payer: Self-pay | Admitting: Gastroenterology

## 2020-10-07 ENCOUNTER — Ambulatory Visit: Payer: Self-pay | Admitting: Surgery

## 2020-10-07 NOTE — H&P (Signed)
History of Present Illness Jocelyn [name]. Ehsan Corvin MD; 10/07/2020 10:23 AM) The patient is a 64 year old female who presents for evaluation of gall stones. Referred by Dr. Felipa Eth and Dr. Ewing Schlein for gallstones  This is a 64 year old female who recently was having blood work for a routine physical. She was noticed to be jaundiced with a bilirubin of 5.3. She was referred to GI. She underwent ERCP on 10/8. A CT scan on 10/6 showed cholelithiasis and choledocholithiasis without sign of acute cholecystitis. In retrospect, the patient has had some intermittent symptoms with postprandial abdominal pain, nausea, and diarrhea. Since her ERCP, she has had some issues with postprandial diarrhea. Currently she is free of any abdominal pain.   CLINICAL DATA: Upper abdominal pain with elevated liver enzymes  EXAM: CT ABDOMEN AND PELVIS WITH CONTRAST  TECHNIQUE: Multidetector CT imaging of the abdomen and pelvis was performed using the standard protocol following bolus administration of intravenous contrast.  CONTRAST: ISOVUE-300 IOPAMIDOL (ISOVUE-300) INJECTION 61%  COMPARISON: None.  FINDINGS: Lower chest: There is atelectatic change in the inferior lingula. There is no edema or airspace consolidation in the lung bases. There are foci of coronary artery calcification.  Hepatobiliary: No focal liver lesions are appreciable. There is cholelithiasis. There is a 3 mm calculus in the distal common bile duct causing biliary duct obstruction with intrahepatic and extrahepatic biliary duct dilatation.  Pancreas: No pancreatic mass or inflammatory focus.  Spleen: No splenic lesions are evident.  Adrenals/Urinary Tract: Adrenals bilaterally appear normal. Kidneys bilaterally show no evident mass or hydronephrosis on either side. There is no appreciable renal or ureteral calculus on either side. Urinary bladder wall thickness is normal.  Stomach/Bowel: There are multiple sigmoid diverticula  without diverticulitis. There is moderate stool in the colon. There is no bowel obstruction. The terminal ileum appears normal. There is no demonstrable free air or portal venous air.  Vascular/Lymphatic: There is no abdominal aortic aneurysm. There is aortic and iliac artery atherosclerotic calcification. Major venous structures appear patent. There is no evident adenopathy in the abdomen or pelvis.  Reproductive: Uterus is anteverted. No evident pelvic mass.  Other: Appendix absent. No periappendiceal region inflammation. No abscess or ascites is evident in the abdomen or pelvis. There is a small umbilical hernia containing only fat.  Musculoskeletal: There is degenerative change in the lower thoracic and lumbar regions. No blastic or lytic bone lesions. No intramuscular lesions.  IMPRESSION: 1. 3 mm calculus in the distal common bile duct causing biliary duct obstruction. There is in addition cholelithiasis. There is a calculus near the junction of the gallbladder and cystic duct. Gallbladder wall does not appear overtly thickened.  2. No evident bowel obstruction. There are sigmoid diverticula without diverticulitis. No abscess in the abdomen or pelvis. Appendix absent.  3. No renal or ureteral calculus. No hydronephrosis. Urinary bladder wall thickness is normal.  4. Aortic Atherosclerosis (ICD10-I70.0). There also foci of coronary artery and iliac artery atherosclerotic calcification.  5. 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  These results will be called to the ordering clinician or representative by the Radiologist Assistant, and communication documented in the PACS or [employer].   Electronically Signed By: Bretta Bang III M.D. On: 09/24/2020 13:17    Problem List/Past Medical Molli Hazard K. Kaisley Stiverson, MD; 10/07/2020 10:23 AM) CHRONIC CHOLECYSTITIS WITH CALCULUS (K80.10) CHOLELITHIASIS WITH CHOLEDOCHOLITHIASIS  (K80.70)  Past Surgical History Laurette Schimke, [place]; 10/07/2020 9:52 AM) Appendectomy Breast Biopsy Right. Tonsillectomy Valve Replacement  Diagnostic Studies History (  Laurette Schimke, RMA; 10/07/2020 9:52 AM) Colonoscopy never Mammogram within last year Pap Smear 1-5 years ago  Allergies Laurette Schimke, RMA; 10/07/2020 9:54 AM) Morphine Sulfate *ANALGESICS - OPIOID* Patient stated that morphine just make her very sick. LGC/RMA Allergies Reconciled  Medication History Jocelyn [name]. Reanna Scoggin, MD; 10/07/2020 10:09 AM) Thera Flake (50-1000MG  Tablet, Oral) Active. Omeprazole (20MG  Tab DR Disint, Oral) Active. Gabapentin (300MG  Capsule, Oral) Active. Medications Reconciled Telmisartan (20MG  Tablet, Oral) Active.  Social History , ; 10/07/2020 9:52 AM) Caffeine use Carbonated beverages, Coffee, Tea. No alcohol use No drug use Tobacco use Never smoker.  Family History Laurette Schimke, [place]; 10/07/2020 9:52 AM) Breast Cancer Sister. Diabetes Mellitus Father, Sister. Hypertension Mother, Sister.  Pregnancy / Birth History Laurette Schimke, [place]; 10/07/2020 9:52 AM) Age at menarche 12 years. Contraceptive History Oral contraceptives. Gravida 3 Length (months) of breastfeeding 3-6 Maternal age 21-20 Para 3  Other Problems [place]. Payton Moder, MD; 10/07/2020 10:23 AM) Back Pain Cholelithiasis Diabetes Mellitus Gastroesophageal Reflux Disease High blood pressure Hypercholesterolemia Myocardial infarction     Review of Systems 19-38 [place] RMA; 10/07/2020 9:52 AM) General Present- Fatigue. Not Present- Appetite Loss, Chills, Fever, Night Sweats, Weight Gain and Weight Loss. Skin Not Present- Change in Wart/Mole, Dryness, Hives, Jaundice, New Lesions, Non-Healing Wounds, Rash and Ulcer. HEENT Present- Wears glasses/contact lenses. Not Present- Earache, Hearing Loss, Hoarseness, Nose Bleed, Oral Ulcers, Ringing in the Ears, Seasonal  Allergies, Sinus Pain, Sore Throat, Visual Disturbances and Yellow Eyes. Respiratory Not Present- Bloody sputum, Chronic Cough, Difficulty Breathing, Snoring and Wheezing. Breast Not Present- Breast Mass, Breast Pain, Nipple Discharge and Skin Changes. Cardiovascular Present- Leg Cramps. Not Present- Chest Pain, Difficulty Breathing Lying Down, Palpitations, Rapid Heart Rate, Shortness of Breath and Swelling of Extremities. Gastrointestinal Present- Chronic diarrhea and Indigestion. Not Present- Abdominal Pain, Bloating, Bloody Stool, Change in Bowel Habits, Constipation, Difficulty Swallowing, Excessive gas, Gets full quickly at meals, Hemorrhoids, Nausea, Rectal Pain and Vomiting. Female Genitourinary Not Present- Frequency, Nocturia, Painful Urination, Pelvic Pain and Urgency. Musculoskeletal Present- Back Pain. Not Present- Joint Pain, Joint Stiffness, Muscle Pain, Muscle Weakness and Swelling of Extremities. Neurological Present- Headaches and Tingling. Not Present- Decreased Memory, Fainting, Numbness, Seizures, Tremor, Trouble walking and Weakness. Psychiatric Not Present- Anxiety, Bipolar, Change in Sleep Pattern, Depression, Fearful and Frequent crying. Endocrine Not Present- Cold Intolerance, Excessive Hunger, Hair Changes, Heat Intolerance, Hot flashes and New Diabetes. Hematology Not Present- Blood Thinners, Easy Bruising, Excessive bleeding, Gland problems, HIV and Persistent Infections.  Vitals 10/09/2020 [place] RMA; 10/07/2020 9:56 AM) 10/07/2020 9:55 AM Weight: 186.5 lb Height: 60in Body Surface Area: 1.81 m Body Mass Index: 36.42 kg/m  Temp.: 98.13F  Pulse: 82 (Regular)  P.OX: 99% (Room air) BP: 108/70(Sitting, Left Arm, Standard)        Physical Exam [place] axe K. Braxtyn Dorff MD; 10/07/2020 10:23 AM)  The physical exam findings are as follows: Note:Constitutional: WDWN in NAD, conversant, no obvious deformities; resting comfortably Eyes: Pupils equal, round; sclera  anicteric; moist conjunctiva; no lid lag HENT: Oral mucosa moist; good dentition Neck: No masses palpated, trachea midline; no thyromegaly Lungs: CTA bilaterally; normal respiratory effort CV: Regular rate and rhythm; no murmurs; extremities well-perfused with no edema Abd: +bowel sounds, soft, non-tender, no palpable organomegaly; no palpable hernias; healed infraumbilical laparoscopic incision Musc: Normal gait; no apparent clubbing or cyanosis in extremities Lymphatic: No palpable cervical or axillary lymphadenopathy Skin: Warm, dry; no sign of jaundice Psychiatric - alert and oriented x 4; calm mood and affect    Assessment &  Plan Molli Hazard K. Raesha Coonrod MD; 10/07/2020 10:16 AM)  CHRONIC CHOLECYSTITIS WITH CALCULUS (K80.10)   CHOLELITHIASIS WITH CHOLEDOCHOLITHIASIS (K80.70)  Current Plans Schedule for Surgery - Laparoscopic cholecystectomy with intraoperative cholangiogram. The surgical procedure has been discussed with the patient. Potential risks, benefits, alternative treatments, and expected outcomes have been explained. All of the patient's questions at this time have been answered. The likelihood of reaching the patient's treatment goal is good. The patient understand the proposed surgical procedure and wishes to proceed.  Jocelyn [name]. Corliss Skains, MD, [facility] Surgery  General/ Trauma Surgery   10/07/2020 10:24 AM

## 2020-10-22 ENCOUNTER — Other Ambulatory Visit: Payer: Self-pay | Admitting: Surgery

## 2022-01-14 ENCOUNTER — Other Ambulatory Visit: Payer: Self-pay | Admitting: Internal Medicine

## 2022-01-14 DIAGNOSIS — Z1231 Encounter for screening mammogram for malignant neoplasm of breast: Secondary | ICD-10-CM

## 2022-01-30 ENCOUNTER — Ambulatory Visit
Admission: RE | Admit: 2022-01-30 | Discharge: 2022-01-30 | Disposition: A | Discharge status: Home / Self Care | Payer: Managed Care, Other (non HMO) | Source: Ambulatory Visit | Attending: Internal Medicine | Admitting: Internal Medicine

## 2022-01-30 DIAGNOSIS — Z1231 Encounter for screening mammogram for malignant neoplasm of breast: Secondary | ICD-10-CM

## 2022-06-15 ENCOUNTER — Telehealth: Payer: Self-pay | Admitting: *Deleted

## 2023-08-04 ENCOUNTER — Other Ambulatory Visit: Payer: Self-pay | Admitting: Internal Medicine

## 2023-08-04 DIAGNOSIS — Z1231 Encounter for screening mammogram for malignant neoplasm of breast: Secondary | ICD-10-CM

## 2023-09-01 ENCOUNTER — Ambulatory Visit
Admission: RE | Admit: 2023-09-01 | Discharge: 2023-09-01 | Disposition: A | Discharge status: Home / Self Care | Payer: Managed Care, Other (non HMO) | Source: Ambulatory Visit | Attending: Internal Medicine | Admitting: Internal Medicine

## 2023-09-01 DIAGNOSIS — Z1231 Encounter for screening mammogram for malignant neoplasm of breast: Secondary | ICD-10-CM

## 2023-09-03 ENCOUNTER — Other Ambulatory Visit: Payer: Self-pay | Admitting: Internal Medicine

## 2023-09-03 DIAGNOSIS — R928 Other abnormal and inconclusive findings on diagnostic imaging of breast: Secondary | ICD-10-CM

## 2023-09-09 ENCOUNTER — Ambulatory Visit
Admission: RE | Admit: 2023-09-09 | Discharge: 2023-09-09 | Disposition: A | Discharge status: Home / Self Care | Payer: Managed Care, Other (non HMO) | Source: Ambulatory Visit | Attending: Internal Medicine | Admitting: Internal Medicine

## 2023-09-09 ENCOUNTER — Other Ambulatory Visit: Payer: Self-pay | Admitting: Internal Medicine

## 2023-09-09 DIAGNOSIS — R928 Other abnormal and inconclusive findings on diagnostic imaging of breast: Secondary | ICD-10-CM

## 2023-09-10 ENCOUNTER — Other Ambulatory Visit: Payer: Self-pay | Admitting: Internal Medicine

## 2023-09-10 DIAGNOSIS — R928 Other abnormal and inconclusive findings on diagnostic imaging of breast: Secondary | ICD-10-CM

## 2023-09-15 ENCOUNTER — Ambulatory Visit
Admission: RE | Admit: 2023-09-15 | Discharge: 2023-09-15 | Disposition: A | Discharge status: Home / Self Care | Payer: Managed Care, Other (non HMO) | Source: Ambulatory Visit | Attending: Internal Medicine | Admitting: Internal Medicine

## 2023-09-15 DIAGNOSIS — R928 Other abnormal and inconclusive findings on diagnostic imaging of breast: Secondary | ICD-10-CM

## 2023-09-15 HISTORY — PX: BREAST BIOPSY: SHX20

## 2023-09-16 LAB — SURGICAL PATHOLOGY

## 2023-11-26 IMAGING — MG MM DIGITAL SCREENING BILAT W/ TOMO AND CAD
8 series · 8 of 24 positions shown · non-contrast
Comparison: Previous exam(s).

ACR Breast Density Category a: The breast tissue is almost entirely
fatty.

CLINICAL DATA: Screening.

EXAM:
DIGITAL SCREENING BILATERAL MAMMOGRAM WITH TOMOSYNTHESIS AND CAD
TECHNIQUE: Bilateral screening digital craniocaudal and mediolateral oblique
mammograms were obtained. Bilateral screening digital breast
tomosynthesis was performed. The images were evaluated with
computer-aided detection.

[L MLO synth-2D]
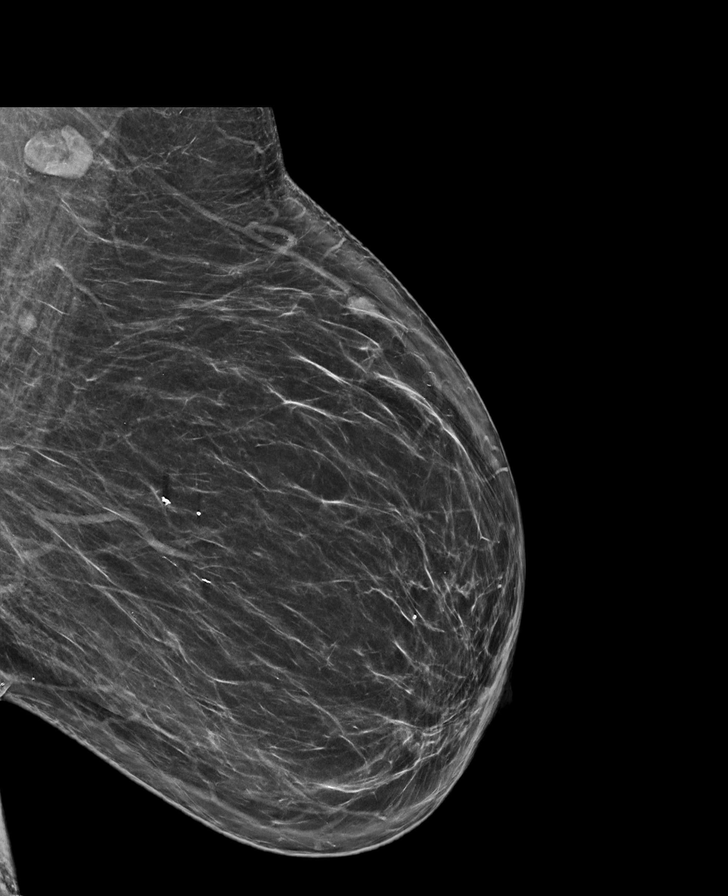

[R MLO synth-2D]
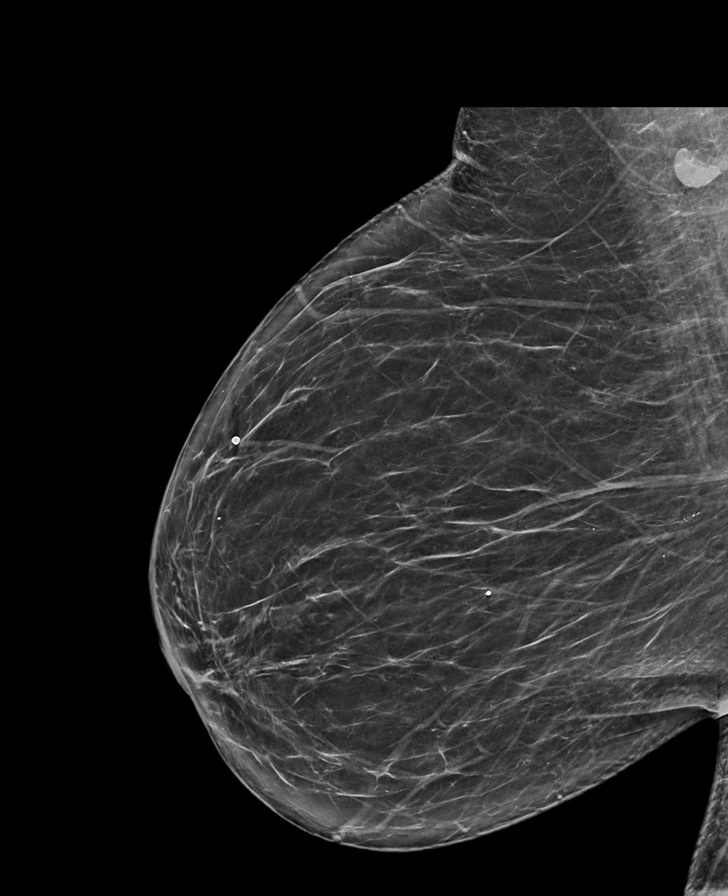

[R CC synth-2D]
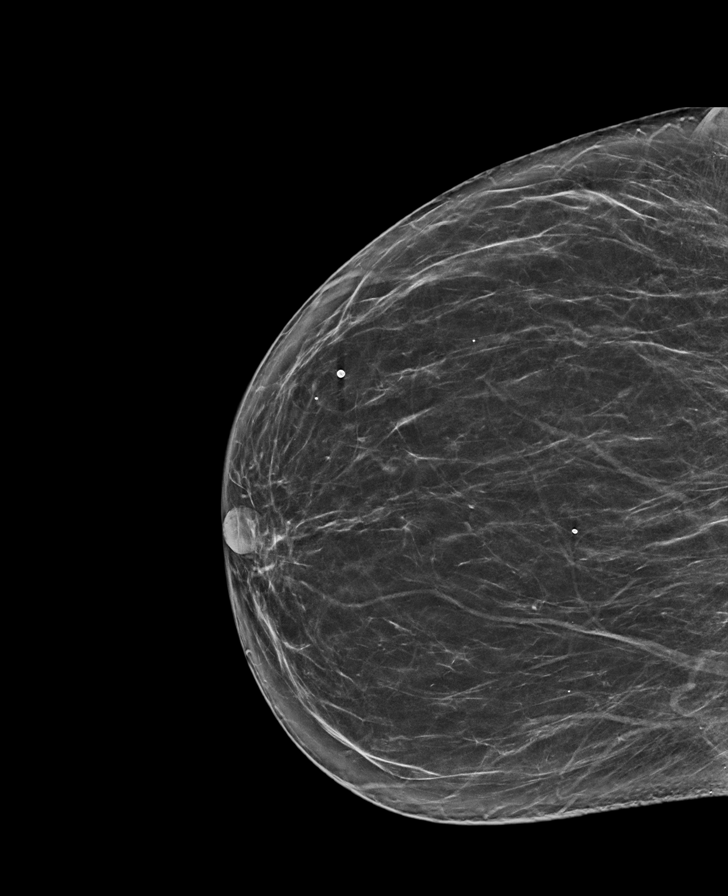

[L CC synth-2D]
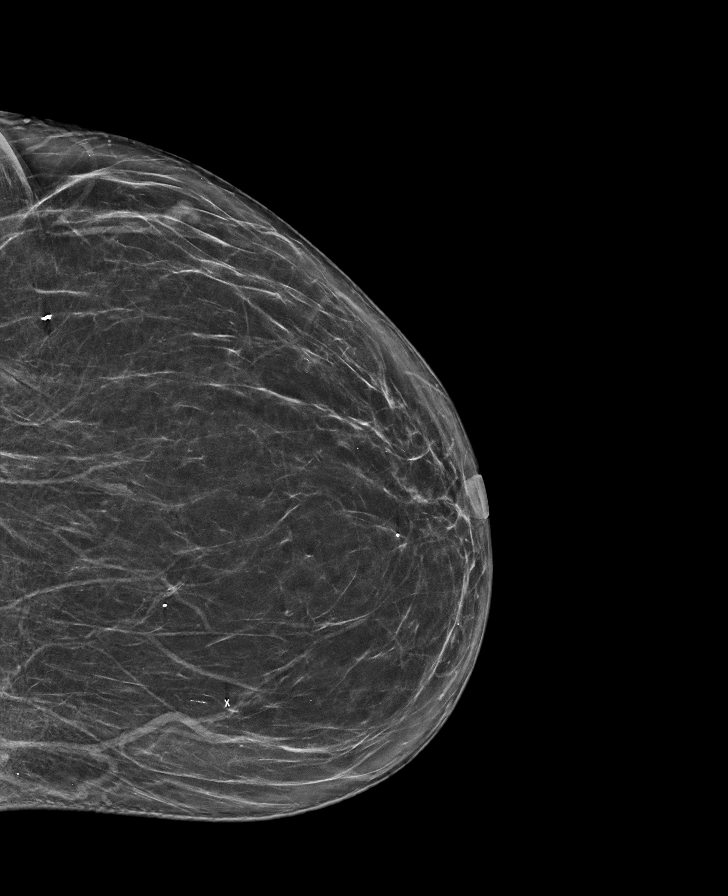

[L CC tomo · tomo slice 36/71.0]
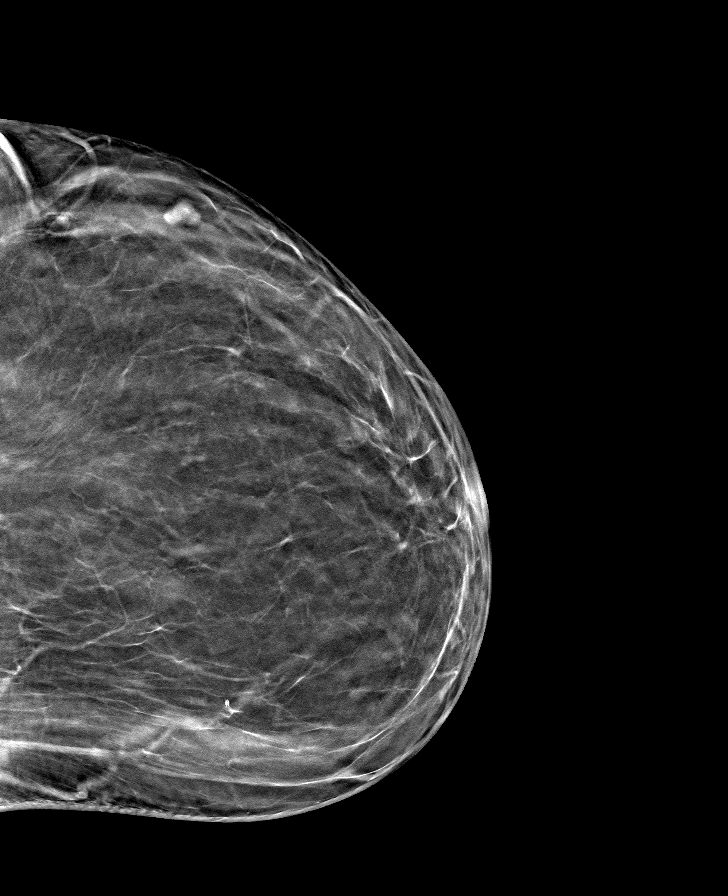

[R CC tomo · tomo slice 36/71.0]
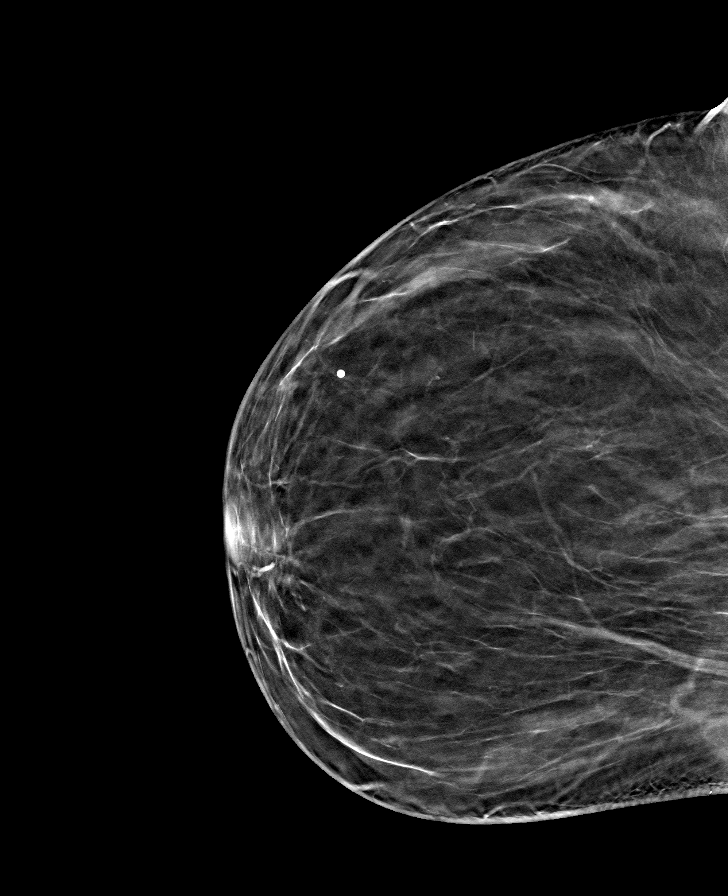

[L MLO tomo · tomo slice 40/79.0]
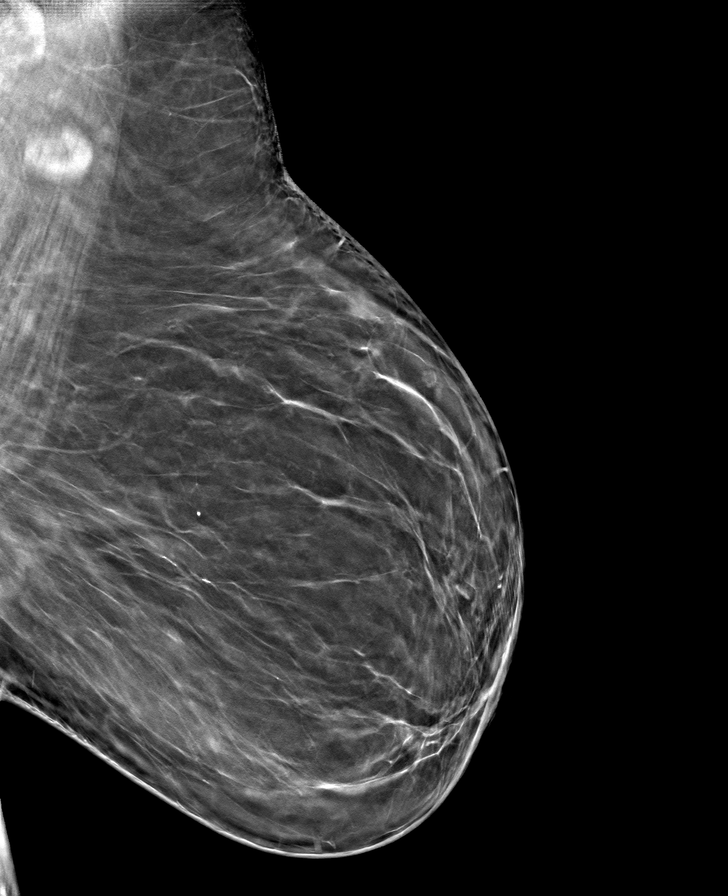

[R MLO tomo · tomo slice 39/77.0]
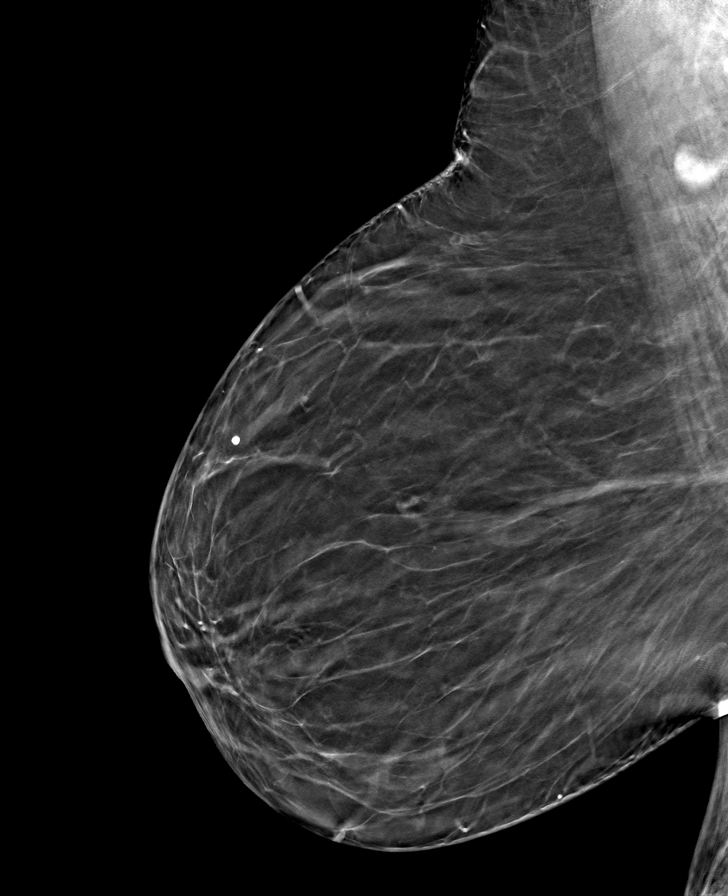

[8 of 24 positions shown; findings below may reference images not displayed]

FINDINGS: There are no findings suspicious for malignancy.
IMPRESSION: No mammographic evidence of malignancy. A result letter of this
screening mammogram will be mailed directly to the patient.

RECOMMENDATION:
Screening mammogram in one year. (Code:0E-3-N98)

BI-RADS CATEGORY  1: Negative.
